# Patient Record
Sex: Female | Born: 1965 | Race: White | Hispanic: No | Marital: Married | Smoking: Former smoker
Health system: Southern US, Community
[De-identification: ages and names within clinical notes are randomized; demographics above are authoritative.]

## PROBLEM LIST (undated history)

## (undated) DIAGNOSIS — I639 Cerebral infarction, unspecified: Secondary | ICD-10-CM

## (undated) DIAGNOSIS — E785 Hyperlipidemia, unspecified: Secondary | ICD-10-CM

## (undated) DIAGNOSIS — T7840XA Allergy, unspecified, initial encounter: Secondary | ICD-10-CM

## (undated) HISTORY — DX: Cerebral infarction, unspecified: I63.9

## (undated) HISTORY — DX: Hyperlipidemia, unspecified: E78.5

## (undated) HISTORY — PX: TUBAL LIGATION: SHX77

## (undated) HISTORY — DX: Allergy, unspecified, initial encounter: T78.40XA

---

## 2007-02-11 ENCOUNTER — Encounter: Payer: Self-pay | Admitting: Maternal & Fetal Medicine

## 2007-04-25 ENCOUNTER — Encounter: Payer: Self-pay | Admitting: Obstetrics and Gynecology

## 2007-05-30 ENCOUNTER — Encounter: Payer: Self-pay | Admitting: Obstetrics and Gynecology

## 2007-07-11 ENCOUNTER — Encounter: Payer: Self-pay | Admitting: Maternal & Fetal Medicine

## 2007-08-15 ENCOUNTER — Ambulatory Visit: Payer: Self-pay | Admitting: Obstetrics and Gynecology

## 2007-08-16 ENCOUNTER — Inpatient Hospital Stay: Payer: Self-pay | Admitting: Obstetrics and Gynecology

## 2010-02-03 ENCOUNTER — Ambulatory Visit: Payer: Self-pay | Admitting: Obstetrics and Gynecology

## 2019-11-19 ENCOUNTER — Inpatient Hospital Stay
Admission: EM | Admit: 2019-11-19 | Discharge: 2019-11-21 | DRG: 066 | Disposition: A | Discharge status: Home / Self Care | Payer: BC Managed Care – PPO | Attending: Hospitalist | Admitting: Hospitalist

## 2019-11-19 ENCOUNTER — Other Ambulatory Visit: Payer: Self-pay

## 2019-11-19 DIAGNOSIS — Z880 Allergy status to penicillin: Secondary | ICD-10-CM

## 2019-11-19 DIAGNOSIS — I639 Cerebral infarction, unspecified: Principal | ICD-10-CM | POA: Diagnosis present

## 2019-11-19 DIAGNOSIS — E785 Hyperlipidemia, unspecified: Secondary | ICD-10-CM | POA: Diagnosis present

## 2019-11-19 DIAGNOSIS — Z823 Family history of stroke: Secondary | ICD-10-CM

## 2019-11-19 DIAGNOSIS — G43909 Migraine, unspecified, not intractable, without status migrainosus: Secondary | ICD-10-CM | POA: Diagnosis present

## 2019-11-19 DIAGNOSIS — Z20822 Contact with and (suspected) exposure to covid-19: Secondary | ICD-10-CM | POA: Diagnosis present

## 2019-11-19 DIAGNOSIS — I6522 Occlusion and stenosis of left carotid artery: Secondary | ICD-10-CM | POA: Diagnosis present

## 2019-11-19 DIAGNOSIS — R112 Nausea with vomiting, unspecified: Secondary | ICD-10-CM

## 2019-11-19 DIAGNOSIS — H538 Other visual disturbances: Secondary | ICD-10-CM | POA: Diagnosis present

## 2019-11-19 DIAGNOSIS — Z807 Family history of other malignant neoplasms of lymphoid, hematopoietic and related tissues: Secondary | ICD-10-CM

## 2019-11-19 DIAGNOSIS — Z825 Family history of asthma and other chronic lower respiratory diseases: Secondary | ICD-10-CM

## 2019-11-19 DIAGNOSIS — R200 Anesthesia of skin: Secondary | ICD-10-CM

## 2019-11-19 DIAGNOSIS — R519 Headache, unspecified: Secondary | ICD-10-CM

## 2019-11-19 DIAGNOSIS — R29700 NIHSS score 0: Secondary | ICD-10-CM | POA: Diagnosis present

## 2019-11-19 LAB — COMPREHENSIVE METABOLIC PANEL
ALT: 39 U/L (ref 0–44)
AST: 33 U/L (ref 15–41)
Albumin: 4.6 g/dL (ref 3.5–5.0)
Alkaline Phosphatase: 98 U/L (ref 38–126)
Anion gap: 12 (ref 5–15)
BUN: 16 mg/dL (ref 6–20)
CO2: 21 mmol/L — ABNORMAL LOW (ref 22–32)
Calcium: 9.4 mg/dL (ref 8.9–10.3)
Chloride: 105 mmol/L (ref 98–111)
Creatinine, Ser: 0.68 mg/dL (ref 0.44–1.00)
GFR calc Af Amer: >60 mL/min (ref >60)
GFR calc non Af Amer: >60 mL/min (ref >60)
Glucose, Bld: 133 mg/dL — ABNORMAL HIGH (ref 70–99)
Potassium: 3.7 mmol/L (ref 3.5–5.1)
Sodium: 138 mmol/L (ref 135–145)
Total Bilirubin: 0.9 mg/dL (ref 0.3–1.2)
Total Protein: 8.5 g/dL — ABNORMAL HIGH (ref 6.5–8.1)

## 2019-11-19 LAB — CBC
HCT: 45.3 % (ref 36.0–46.0)
Hemoglobin: 15.7 g/dL — ABNORMAL HIGH (ref 12.0–15.0)
MCH: 29.3 pg (ref 26.0–34.0)
MCHC: 34.7 g/dL (ref 30.0–36.0)
MCV: 84.5 fL (ref 80.0–100.0)
Platelets: 397 10*3/uL (ref 150–400)
RBC: 5.36 MIL/uL — ABNORMAL HIGH (ref 3.87–5.11)
RDW: 12.7 % (ref 11.5–15.5)
WBC: 13.8 10*3/uL — ABNORMAL HIGH (ref 4.0–10.5)
nRBC: 0 % (ref 0.0–0.2)

## 2019-11-19 LAB — LIPASE, BLOOD: Lipase: 28 U/L (ref 11–51)

## 2019-11-19 MED ORDER — ONDANSETRON HCL 4 MG/2ML IJ SOLN
INTRAMUSCULAR | Status: AC
  Administered 2019-11-19: 4 mg via INTRAVENOUS
  Filled 2019-11-19: qty 2

## 2019-11-19 MED ORDER — DROPERIDOL 2.5 MG/ML IJ SOLN
2.5000 mg | Freq: Once | INTRAMUSCULAR | Status: AC
  Administered 2019-11-19: 2.5 mg via INTRAVENOUS
  Filled 2019-11-19: qty 2

## 2019-11-19 MED ORDER — ONDANSETRON HCL 4 MG/2ML IJ SOLN
4.0000 mg | Freq: Once | INTRAMUSCULAR | Status: AC
  Administered 2019-11-19: 4 mg via INTRAVENOUS
  Filled 2019-11-19: qty 2

## 2019-11-19 MED ORDER — SODIUM CHLORIDE 0.9% FLUSH: 3.0000 mL | Freq: Once | INTRAVENOUS | Status: DC

## 2019-11-19 MED ORDER — ONDANSETRON HCL 4 MG/2ML IJ SOLN: 4.0000 mg | Freq: Once | INTRAMUSCULAR | Status: AC | PRN

## 2019-11-19 MED ORDER — SODIUM CHLORIDE 0.9 % IV BOLUS
1000.0000 mL | Freq: Once | INTRAVENOUS | Status: AC
  Administered 2019-11-19: 1000 mL via INTRAVENOUS

## 2019-11-19 NOTE — ED Triage Notes (Signed)
Pt to ED via EMS with new onset of vomiting and migraine this evening. Pt has hx of headaches and migraines. PT not answering many of RNs questions and reporting right arm numbness. No color or temperature change. Radial pulse intact. No weakness noted and no other neurologic deficits noted at this time. Pt is diaphoretic and dry heaving in triage.

## 2019-11-19 NOTE — ED Notes (Signed)
Pt uprite on stretcher in exam room with no distress noted; reports generalized HA today accomp by N/V; st hx of same; SO at bedside

## 2019-11-19 NOTE — ED Provider Notes (Signed)
[facility] Emergency Department Provider Note   ____________________________________________   First MD Initiated Contact with Patient 11/19/19 2320     (approximate)  I have reviewed the triage vital signs and the nursing notes.   HISTORY  Chief Complaint Emesis    HPI Samantha Powers is a 54 y.o. female brought to the ED from home via EMS with a chief complaint of migraine headache and vomiting.  Patient reports a history of migraine headaches for over 25 years, although she was never formally diagnosed and does not take any preventative medications nor sees a neurologist.  Awoke this morning with frontal headache and vomiting which is typical with her migraine headaches.  Had some right arm numbness which is new from her prior episodes.  Denies extremity weakness, slurred speech, falling or confusion.  Denies fever, cough, chest pain, shortness of breath, abdominal pain, dizziness.  Denies striking head or LOC.  Denies anticoagulant use.     Past medical history Migraine  Patient Active Problem List   Diagnosis Date Noted  . Acute CVA (cerebrovascular accident) ([place]) 11/20/2019  . Acute intractable headache 11/20/2019     Prior to Admission medications   Medication Sig Start Date End Date Taking? Authorizing Provider  ibuprofen (ADVIL) 200 MG tablet Take 200 mg by mouth every 6 (six) hours as needed for headache.   Yes [provider]    Allergies Penicillins  Family history None for migraines  Social History Social History   Tobacco Use  . Smoking status: Not on file  Substance Use Topics  . Alcohol use: Not on file  . Drug use: Not on file  Non-smoker  Review of Systems  Constitutional: No fever/chills Eyes: No visual changes. ENT: No sore throat. Cardiovascular: Denies chest pain. Respiratory: Denies shortness of breath. Gastrointestinal: No abdominal pain.  No nausea, no vomiting.  No diarrhea.  No  constipation. Genitourinary: Negative for dysuria. Musculoskeletal: Negative for back pain. Skin: Negative for rash. Neurological: Positive for headache. Negative for focal weakness or numbness.   ____________________________________________   PHYSICAL EXAM:  VITAL SIGNS: ED Triage Vitals  Enc Vitals Group     BP 11/19/19 2303 (!) 153/92     Pulse Rate 11/19/19 2303 99     Resp 11/19/19 2303 16     Temp 11/19/19 2303 98.4 F (36.9 C)     Temp Source 11/19/19 2303 Oral     SpO2 11/19/19 2303 98 %     Weight 11/19/19 2304 150 lb (68 kg)     Height 11/19/19 2304 5\' 4"  (1.626 m)     Head Circumference --      Peak Flow --      Pain Score 11/19/19 2304 10     Pain Loc --      Pain Edu? --      Excl. in [place]? --     Constitutional: Alert and oriented. Well appearing and in mild acute distress. Eyes: Conjunctivae are normal. PERRL. EOMI. Head: Atraumatic.  Frontal sinuses mildly tender to palpation. Nose: No congestion/rhinnorhea. Mouth/Throat: Mucous membranes are mildly dry. Neck: No stridor.  Supple neck without meningismus.  No carotid bruits. Cardiovascular: Normal rate, regular rhythm. Grossly normal heart sounds.  Good peripheral circulation. Respiratory: Normal respiratory effort.  No retractions. Lungs CTAB. Gastrointestinal: Actively vomiting. Soft and nontender. No distention. No abdominal bruits. No CVA tenderness. Musculoskeletal: No lower extremity tenderness nor edema.  No joint effusions. Neurologic: Alert and oriented x3.  CN II-XII  grossly intact.  Normal speech and language. No gross focal neurologic deficits are appreciated. MAEx4.  Skin:  Skin is warm, dry and intact. No rash noted.  No petechiae. Psychiatric: Mood and affect are normal. Speech and behavior are normal.  ____________________________________________   LABS (all labs ordered are listed, but only abnormal results are displayed)  Labs Reviewed  COMPREHENSIVE METABOLIC PANEL - Abnormal;  Notable for the following components:      Result Value   CO2 21 (*)    Glucose, Bld 133 (*)    Total Protein 8.5 (*)    All other components within normal limits  CBC - Abnormal; Notable for the following components:   WBC 13.8 (*)    RBC 5.36 (*)    Hemoglobin 15.7 (*)    All other components within normal limits  LIPID PANEL - Abnormal; Notable for the following components:   Cholesterol 261 (*)    Triglycerides 240 (*)    VLDL 48 (*)    LDL Cholesterol 167 (*)    All other components within normal limits  SARS CORONAVIRUS 2 (TAT 6-24 HRS)  LIPASE, BLOOD  CREATININE, SERUM  URINALYSIS, COMPLETE (UACMP) WITH MICROSCOPIC  HIV ANTIBODY (ROUTINE TESTING W REFLEX)  HEMOGLOBIN A1C  POC URINE PREG, ED   ____________________________________________  EKG  ED ECG REPORT I, Thurl Boen J, the attending physician, personally viewed and interpreted this ECG.   Date: 11/19/2019  EKG Time: 2344  Rate: 88  Rhythm: normal EKG, normal sinus rhythm  Axis: Normal  Intervals:none  ST&T Change: Nonspecific  ____________________________________________  RADIOLOGY  ED MD interpretation: Multiple small acute infarcts within the left hemisphere  Official radiology report(s): MR BRAIN WO CONTRAST  Result Date: 11/20/2019 CLINICAL DATA:  Headache with vomiting. Right arm numbness. EXAM: MRI HEAD WITHOUT CONTRAST TECHNIQUE: Multiplanar, multiecho pulse sequences of the brain and surrounding structures were obtained without intravenous contrast. COMPARISON:  None. FINDINGS: Brain: Multiple small foci of abnormal diffusion restriction within the left hemisphere, including the left occipital lobe, left parietal periatrial white matter and at the watershed zone between the left anterior and middle cerebral arteries. No diffusion abnormality in the right hemisphere or cerebellum. Multifocal white matter hyperintensity, most commonly due to chronic ischemic microangiopathy. Normal volume of CSF  spaces. No chronic microhemorrhage. Normal midline structures. Vascular: Normal flow voids. Skull and upper cervical spine: Normal marrow signal. Sinuses/Orbits: Negative. Other: None. IMPRESSION: 1. 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 2. No hemorrhage or mass effect. Electronically Signed   By: Deatra Robinson M.D.   On: 11/20/2019 02:19    ____________________________________________   PROCEDURES  Procedure(s) performed (including Critical Care):  Procedures  Interval: Baseline Time: 7:27 AM Person Administering Scale: Dreshon Proffit J  Administer stroke scale items in the order listed. Record performance in each category after each subscale exam. Do not go back and change scores. Follow directions provided for each exam technique. Scores should reflect what the patient does, not what the clinician thinks the patient can do. The clinician should record answers while administering the exam and work quickly. Except where indicated, the patient should not be coached (i.e., repeated requests to patient to make a special effort).   1a  Level of consciousness: 0=alert; keenly responsive  1b. LOC questions:  0=Performs both tasks correctly  1c. LOC commands: 0=Performs both tasks correctly  2.  Best Gaze:  0=normal  3.  Visual: 0=No visual loss  4. Facial Palsy: 0=Normal symmetric movement  5a.  Motor left arm: 0=No drift, limb holds 90 (or 45) degrees for full 10 seconds  5b.  Motor right arm: 0=No drift, limb holds 90 (or 45) degrees for full 10 seconds  6a. motor left leg: 0=No drift, limb holds 90 (or 45) degrees for full 10 seconds  6b  Motor right leg:  0=No drift, limb holds 90 (or 45) degrees for full 10 seconds  7. Limb Ataxia: 0=Absent  8.  Sensory: 0=Normal; no sensory loss  9. Best Language:  0=No aphasia, normal  10. Dysarthria:  0=Normal  11. Extinction and Inattention: 0=No abnormality  12. Distal motor function: 0=Normal   Total:   0    ____________________________________________   INITIAL IMPRESSION / ASSESSMENT AND PLAN / ED COURSE  As part of my medical decision making, I reviewed the following data within the electronic MEDICAL RECORD NUMBER History obtained from family, Nursing notes reviewed and incorporated, Labs reviewed, EKG interpreted, Old chart reviewed, Radiograph reviewed and Notes from prior ED visits     Samantha Powers was evaluated in Emergency Department on 11/20/2019 for the symptoms described in the history of present illness. She was evaluated in the context of the global COVID-19 pandemic, which necessitated consideration that the patient might be at risk for infection with the SARS-CoV-2 virus that causes COVID-19. Institutional protocols and algorithms that pertain to the evaluation of patients at risk for COVID-19 are in a state of rapid change based on information released by regulatory bodies including the CDC and federal and state organizations. These policies and algorithms were followed during the patient's care in the ED.    54 year old female who presents with migraine headache and vomiting. Differential diagnosis includes, but is not limited to, intracranial hemorrhage, meningitis/encephalitis, previous head trauma, cavernous venous thrombosis, tension headache, temporal arteritis, migraine or migraine equivalent, idiopathic intracranial hypertension, and non-specific headache.  Will initiate IV fluid resuscitation, IV headache cocktail.  MRI brain for complaints of RUE numbness.   Clinical Course as of Nov 19 725  Thu Nov 20, 2019  0131 Headache improved.  No vomiting.  Going to MRI.   [JS]  0300 Updated patient and spouse on MRI results.  Will administer aspirin and discuss with hospitalist services for admission.   [JS]    Clinical Course User Index [JS] Irean Hong, MD      ____________________________________________   FINAL CLINICAL IMPRESSION(S) / ED DIAGNOSES  Final diagnoses:  Acute nonintractable headache, unspecified headache type  Non-intractable vomiting with nausea, unspecified vomiting type  Numbness  Cerebrovascular accident (CVA), unspecified mechanism [facility])     ED Discharge Orders    None       Note:  This document was prepared using Dragon voice recognition software and may include unintentional dictation errors.   Irean Hong, MD 11/20/19 [phone]

## 2019-11-19 NOTE — ED Notes (Signed)
1st RN note: pt arrives from home via [facility] with c/o emesis and headache x1 day. No reported visual changes. Pt took OTC Advil without relief. EMS reports CBG 127; BP 182/84.

## 2019-11-20 ENCOUNTER — Emergency Department: Payer: BC Managed Care – PPO

## 2019-11-20 ENCOUNTER — Observation Stay
Admit: 2019-11-20 | Discharge: 2019-11-20 | Disposition: A | Discharge status: Home / Self Care | Payer: BC Managed Care – PPO | Attending: Internal Medicine | Admitting: Internal Medicine

## 2019-11-20 ENCOUNTER — Encounter: Payer: Self-pay | Admitting: Hospitalist

## 2019-11-20 ENCOUNTER — Inpatient Hospital Stay: Payer: BC Managed Care – PPO

## 2019-11-20 DIAGNOSIS — Z823 Family history of stroke: Secondary | ICD-10-CM | POA: Diagnosis not present

## 2019-11-20 DIAGNOSIS — R29700 NIHSS score 0: Secondary | ICD-10-CM | POA: Diagnosis present

## 2019-11-20 DIAGNOSIS — R112 Nausea with vomiting, unspecified: Secondary | ICD-10-CM | POA: Diagnosis present

## 2019-11-20 DIAGNOSIS — E785 Hyperlipidemia, unspecified: Secondary | ICD-10-CM | POA: Diagnosis present

## 2019-11-20 DIAGNOSIS — I639 Cerebral infarction, unspecified: Principal | ICD-10-CM

## 2019-11-20 DIAGNOSIS — Z807 Family history of other malignant neoplasms of lymphoid, hematopoietic and related tissues: Secondary | ICD-10-CM | POA: Diagnosis not present

## 2019-11-20 DIAGNOSIS — R519 Headache, unspecified: Secondary | ICD-10-CM | POA: Diagnosis not present

## 2019-11-20 DIAGNOSIS — G43909 Migraine, unspecified, not intractable, without status migrainosus: Secondary | ICD-10-CM | POA: Diagnosis present

## 2019-11-20 DIAGNOSIS — Z880 Allergy status to penicillin: Secondary | ICD-10-CM | POA: Diagnosis not present

## 2019-11-20 DIAGNOSIS — Z20822 Contact with and (suspected) exposure to covid-19: Secondary | ICD-10-CM | POA: Diagnosis present

## 2019-11-20 DIAGNOSIS — Z825 Family history of asthma and other chronic lower respiratory diseases: Secondary | ICD-10-CM | POA: Diagnosis not present

## 2019-11-20 DIAGNOSIS — H538 Other visual disturbances: Secondary | ICD-10-CM | POA: Diagnosis present

## 2019-11-20 DIAGNOSIS — I6522 Occlusion and stenosis of left carotid artery: Secondary | ICD-10-CM | POA: Diagnosis present

## 2019-11-20 LAB — URINALYSIS, COMPLETE (UACMP) WITH MICROSCOPIC
Bacteria, UA: NONE SEEN
Bilirubin Urine: NEGATIVE
Glucose, UA: NEGATIVE mg/dL
Hgb urine dipstick: NEGATIVE
Ketones, ur: 20 mg/dL — AB
Nitrite: NEGATIVE
Protein, ur: NEGATIVE mg/dL
Specific Gravity, Urine: 1.015 (ref 1.005–1.030)
pH: 6 (ref 5.0–8.0)

## 2019-11-20 LAB — HEMOGLOBIN A1C
Hgb A1c MFr Bld: 5.7 % — ABNORMAL HIGH (ref 4.8–5.6)
Mean Plasma Glucose: 116.89 mg/dL

## 2019-11-20 LAB — LIPID PANEL
Cholesterol: 261 mg/dL — ABNORMAL HIGH (ref 0–200)
HDL: 46 mg/dL
LDL Cholesterol: 167 mg/dL — ABNORMAL HIGH (ref 0–99)
Total CHOL/HDL Ratio: 5.7 ratio
Triglycerides: 240 mg/dL — ABNORMAL HIGH
VLDL: 48 mg/dL — ABNORMAL HIGH (ref 0–40)

## 2019-11-20 LAB — CREATININE, SERUM
Creatinine, Ser: 0.64 mg/dL (ref 0.44–1.00)
GFR calc Af Amer: >60 mL/min (ref >60)
GFR calc non Af Amer: >60 mL/min (ref >60)

## 2019-11-20 LAB — ANTITHROMBIN III: AntiThromb III Func: 120 % (ref 75–120)

## 2019-11-20 LAB — ECHOCARDIOGRAM COMPLETE
Height: 64 in
Weight: 2400 oz

## 2019-11-20 LAB — PROTIME-INR
INR: 1.1 (ref 0.8–1.2)
Prothrombin Time: 13.8 s (ref 11.4–15.2)

## 2019-11-20 LAB — POCT PREGNANCY, URINE: Preg Test, Ur: NEGATIVE

## 2019-11-20 LAB — SARS CORONAVIRUS 2 (TAT 6-24 HRS): SARS Coronavirus 2: NEGATIVE

## 2019-11-20 LAB — HIV ANTIBODY (ROUTINE TESTING W REFLEX): HIV Screen 4th Generation wRfx: NONREACTIVE

## 2019-11-20 MED ORDER — CLOPIDOGREL BISULFATE 75 MG PO TABS
75.0000 mg | ORAL_TABLET | Freq: Every day | ORAL | Status: DC
  Administered 2019-11-21: 75 mg via ORAL
  Filled 2019-11-20: qty 1

## 2019-11-20 MED ORDER — ACETAMINOPHEN 325 MG PO TABS
650.0000 mg | ORAL_TABLET | ORAL | Status: DC | PRN
  Administered 2019-11-20 – 2019-11-21 (×4): 650 mg via ORAL
  Filled 2019-11-20 (×4): qty 2

## 2019-11-20 MED ORDER — STROKE: EARLY STAGES OF RECOVERY BOOK: Freq: Once | Status: DC

## 2019-11-20 MED ORDER — ATORVASTATIN CALCIUM 20 MG PO TABS: 20.0000 mg | ORAL_TABLET | Freq: Every day | ORAL | Status: DC

## 2019-11-20 MED ORDER — SENNOSIDES-DOCUSATE SODIUM 8.6-50 MG PO TABS: 1.0000 | ORAL_TABLET | Freq: Every evening | ORAL | Status: DC | PRN

## 2019-11-20 MED ORDER — ACETAMINOPHEN 160 MG/5ML PO SOLN
650.0000 mg | ORAL | Status: DC | PRN
  Filled 2019-11-20: qty 20.3

## 2019-11-20 MED ORDER — ASPIRIN EC 325 MG PO TBEC
325.0000 mg | DELAYED_RELEASE_TABLET | Freq: Every day | ORAL | Status: DC
  Administered 2019-11-20: 325 mg via ORAL
  Filled 2019-11-20: qty 1

## 2019-11-20 MED ORDER — ASPIRIN EC 81 MG PO TBEC
81.0000 mg | DELAYED_RELEASE_TABLET | Freq: Every day | ORAL | Status: DC
  Administered 2019-11-21: 09:00:00 81 mg via ORAL
  Filled 2019-11-20: qty 1

## 2019-11-20 MED ORDER — ENOXAPARIN SODIUM 40 MG/0.4ML ~~LOC~~ SOLN
40.0000 mg | SUBCUTANEOUS | Status: DC
  Administered 2019-11-20 – 2019-11-21 (×2): 40 mg via SUBCUTANEOUS
  Filled 2019-11-20 (×3): qty 0.4

## 2019-11-20 MED ORDER — ATORVASTATIN CALCIUM 20 MG PO TABS: 40.0000 mg | ORAL_TABLET | Freq: Every day | ORAL | Status: DC

## 2019-11-20 MED ORDER — DROPERIDOL 2.5 MG/ML IJ SOLN
2.5000 mg | Freq: Once | INTRAMUSCULAR | Status: AC
  Administered 2019-11-20: 2.5 mg via INTRAVENOUS
  Filled 2019-11-20: qty 2

## 2019-11-20 MED ORDER — ACETAMINOPHEN 650 MG RE SUPP: 650.0000 mg | RECTAL | Status: DC | PRN

## 2019-11-20 MED ORDER — IOHEXOL 350 MG/ML SOLN
75.0000 mL | Freq: Once | INTRAVENOUS | Status: AC | PRN
  Administered 2019-11-20: 75 mL via INTRAVENOUS
  Filled 2019-11-20: qty 75

## 2019-11-20 NOTE — Consult Note (Signed)
Requesting Physician: Fran Lowes    Chief Complaint: Headache, nausea, vomiting  I have been asked by Dr. Fran Lowes to see this patient in consultation for stroke.  HPI: Samantha Powers is an 54 y.o. female with no significant PMH who presented on 3/17 with headache.  Patient has a history of migraines for which she usually takes Advil and spends most of the day in bed.  Awakened yesterday with severe headache.  Also had nausea and vomiting for the majority of the day.  Her husband reports that her words were not coming out correctly.  He finally called EMS.  On arrival the patient reports that her hands were drawing bilaterally but she never experienced any extremity weakness.  Now feels back to baseline for the most part.  Initial NIHSS of 0.  Date last known well: 11/19/2019 Time last known well: Time: 00:00 tPA Given: No: Resolution of symptoms  Past medical history: Migraine   Family history: Maternal GF with stroke.  Father deceased at 77 with lymphoma.  Mother alive with COPD.   Social History:  No history of tobacco abuse  Allergies:  Allergies  Allergen Reactions  . Penicillins     Medications: None  ROS: History obtained from the patient  General ROS: negative for - chills, fatigue, fever, night sweats, weight gain or weight loss Psychological ROS: negative for - behavioral disorder, hallucinations, memory difficulties, mood swings or suicidal ideation Ophthalmic ROS: negative for - blurry vision, double vision, eye pain or loss of vision ENT ROS: negative for - epistaxis, nasal discharge, oral lesions, sore throat, tinnitus or vertigo Allergy and Immunology ROS: negative for - hives or itchy/watery eyes Hematological and Lymphatic ROS: negative for - bleeding problems, bruising or swollen lymph nodes Endocrine ROS: negative for - galactorrhea, hair pattern changes, polydipsia/polyuria or temperature intolerance Respiratory ROS: negative for - cough, hemoptysis, shortness of breath  or wheezing Cardiovascular ROS: negative for - chest pain, dyspnea on exertion, edema or irregular heartbeat Gastrointestinal ROS: as noted in HPI Genito-Urinary ROS: negative for - dysuria, hematuria, incontinence or urinary frequency/urgency Musculoskeletal ROS: negative for - joint swelling or muscular weakness Neurological ROS: as noted in HPI Dermatological ROS: negative for rash and skin lesion changes  Physical Examination: Blood pressure (!) 168/99, pulse (!) 111, temperature 98.4 F (36.9 C), temperature source Oral, resp. rate 18, height 5\' 4"  (1.626 m), weight 68 kg, SpO2 96 %.  HEENT-  Normocephalic, no lesions, without obvious abnormality.  Normal external eye and conjunctiva.  Normal TM's bilaterally.  Normal auditory canals and external ears. Normal external nose, mucus membranes and septum.  Normal pharynx. Cardiovascular- S1, S2 normal, pulses palpable throughout   Lungs- chest clear, no wheezing, rales, normal symmetric air entry Abdomen- soft, non-tender; bowel sounds normal; no masses,  no organomegaly Extremities- no edema Lymph-no adenopathy palpable Musculoskeletal-no joint tenderness, deformity or swelling Skin-warm and dry, no hyperpigmentation, vitiligo, or suspicious lesions  Neurological Examination   Mental Status: Alert, oriented, thought content appropriate.  Speech fluent without evidence of aphasia.  Able to follow 3 step commands without difficulty. Cranial Nerves: II: Discs flat bilaterally; Visual fields grossly normal, pupils equal, round, reactive to light and accommodation III,IV, VI: ptosis not present, extra-ocular motions intact bilaterally V,VII: smile symmetric, facial light touch sensation normal bilaterally VIII: hearing normal bilaterally IX,X: gag reflex present XI: bilateral shoulder shrug XII: midline tongue extension Motor: Right : Upper extremity   5/5    Left:     Upper extremity  5/5  Lower extremity   5/5     Lower extremity    5/5 Tone and bulk:normal tone throughout; no atrophy noted Sensory: Pinprick and light touch intact throughout, bilaterally Deep Tendon Reflexes: Symmetric throughout Plantars: Right: downgoing   Left: downgoing Cerebellar: Normal finger-to-nose and normal heel-to-shin testing bilaterally Gait: not tested due to safety concerns    Laboratory Studies:  Basic Metabolic Panel: Recent Labs  Lab 11/19/19 2254 11/20/19 0337  NA 138  --   K 3.7  --   CL 105  --   CO2 21*  --   GLUCOSE 133*  --   BUN 16  --   CREATININE 0.68 0.64  CALCIUM 9.4  --     Liver Function Tests: Recent Labs  Lab 11/19/19 2254  AST 33  ALT 39  ALKPHOS 98  BILITOT 0.9  PROT 8.5*  ALBUMIN 4.6   Recent Labs  Lab 11/19/19 2254  LIPASE 28   No results for input(s): AMMONIA in the last 168 hours.  CBC: Recent Labs  Lab 11/19/19 2254  WBC 13.8*  HGB 15.7*  HCT 45.3  MCV 84.5  PLT 397    Cardiac Enzymes: No results for input(s): CKTOTAL, CKMB, CKMBINDEX, TROPONINI in the last 168 hours.  BNP: Invalid input(s): POCBNP  CBG: No results for input(s): GLUCAP in the last 168 hours.  Microbiology: No results found for this or any previous visit.  Coagulation Studies: No results for input(s): LABPROT, INR in the last 72 hours.  Urinalysis:  Recent Labs  Lab 11/20/19 0337  COLORURINE YELLOW*  LABSPEC 1.015  PHURINE 6.0  GLUCOSEU NEGATIVE  HGBUR NEGATIVE  BILIRUBINUR NEGATIVE  KETONESUR 20*  PROTEINUR NEGATIVE  NITRITE NEGATIVE  LEUKOCYTESUR TRACE*    Lipid Panel:    Component Value Date/Time   CHOL 261 (H) 11/19/2019 2254   TRIG 240 (H) 11/19/2019 2254   HDL 46 11/19/2019 2254   CHOLHDL 5.7 11/19/2019 2254   VLDL 48 (H) 11/19/2019 2254   LDLCALC 167 (H) 11/19/2019 2254    HgbA1C: No results found for: HGBA1C  Urine Drug Screen:  No results found for: LABOPIA, COCAINSCRNUR, LABBENZ, AMPHETMU, THCU, LABBARB  Alcohol Level: No results for input(s): ETH in the last 168  hours.  Other results: EKG: sinus rhythm at 88 bpm.  Imaging: MR BRAIN WO CONTRAST  Result Date: 11/20/2019 CLINICAL DATA:  Headache with vomiting. Right arm numbness. EXAM: MRI HEAD WITHOUT CONTRAST TECHNIQUE: Multiplanar, multiecho pulse sequences of the brain and surrounding structures were obtained without intravenous contrast. COMPARISON:  None. FINDINGS: Brain: Multiple small foci of abnormal diffusion restriction within the left hemisphere, including the left occipital lobe, left parietal periatrial white matter and at the watershed zone between the left anterior and middle cerebral arteries. No diffusion abnormality in the right hemisphere or cerebellum. Multifocal white matter hyperintensity, most commonly due to chronic ischemic microangiopathy. Normal volume of CSF spaces. No chronic microhemorrhage. Normal midline structures. Vascular: Normal flow voids. Skull and upper cervical spine: Normal marrow signal. Sinuses/Orbits: Negative. Other: None. IMPRESSION: 1. 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 2. No hemorrhage or mass effect. Electronically Signed   By: Ulyses Jarred M.D.   On: 11/20/2019 02:19    Assessment: 54 y.o. female with no known PMH presenting with headache, nausea, vomiting and difficulty with speech.  Patient now at baseline.  MRI  of the brain personally reviewed and shows multiple small infarcts in the left hemisphere.  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  Patient on no antiplatelet therapy prior to admission.  Has not seen a physician in quite some time.  Further work up for stroke etiology recommended.    Stroke Risk Factors - hyperlipidemia  Plan: 1. HgbA1c, fasting lipid panel, hypercoag panel 2. CTA of the head and neck 3. PT consult, OT consult, Speech consult 4. Echocardiogram with bubble study 5. Prophylactic  therapy-Dual antiplatelet therapy with ASA 81mg  and Plavix 75mg  for three weeks with change to ASA 81mg  alone as monotherapy after that time. 6. NPO until RN stroke swallow screen 7. Telemetry monitoring 8. Frequent neuro checks  , MD Neurology [phone] 11/20/2019, 10:39 AM

## 2019-11-20 NOTE — ED Notes (Signed)
Pt to MRI via stretcher accomp by MRI tech 

## 2019-11-20 NOTE — ED Notes (Signed)
Dr. Duncan at bedside 

## 2019-11-20 NOTE — H&P (Signed)
History and Physical    Samantha Powers:[id number] DOB: 1966/01/29 DOA: 11/19/2019  PCP: Patient, No Pcp Per   Patient coming from: home I have personally briefly reviewed patient's old medical records in [facility]  Chief Complaint: headache and vomiting, right upper extremity numbness  HPI: Samantha Powers is a 54 y.o. female with medical history significant for longstanding history of migraines, self diagnosed with no prior neurology evaluation who presents to the emergency room with a 1 day history of headache and vomiting, typical for her migraines at this time associated with numbness in bilateral hands, since resolved.  She denied fever or chills, visual disturbance or weakness in the face or extremities.  She stated the headache was a right frontal headache typical for her migraines.  The vomiting was nonbloody nonbilious and she had 3 episodes which is not unusual for her migraines  ED Course: On arrival to the emergency room vitals were within normal limits except for slightly elevated blood pressure of 153/92.  Blood work was unremarkable except for slightly elevated white cell count of 13,800.  Lipase was 28.  MRI revealed acute infarcts within the left hemisphere.  Patient received droperidol and ondansetron in the ER with relief of a headache.  Hospitalist consulted for admission Review of Systems: As per HPI otherwise 10 point review of systems negative.    No past medical history on file.  History of migraines   has no history on file for tobacco, alcohol, and drug.  Allergies  Allergen Reactions  . Penicillins     No family history on file.   Prior to Admission medications   Not on File    Physical Exam: Vitals:   11/19/19 2303 11/19/19 2304 11/19/19 2322  BP: (!) 153/92  (!) 174/95  Pulse: 99  89  Resp: 16  18  Temp: 98.4 F (36.9 C)    TempSrc: Oral    SpO2: 98%  99%  Weight:  68 kg   Height:  5\' 4"  (1.626 m)      Vitals:   11/19/19 2303  11/19/19 2304 11/19/19 2322  BP: (!) 153/92  (!) 174/95  Pulse: 99  89  Resp: 16  18  Temp: 98.4 F (36.9 C)    TempSrc: Oral    SpO2: 98%  99%  Weight:  68 kg   Height:  5\' 4"  (1.626 m)     Constitutional: Alert and awake, oriented x3, not in any acute distress. Eyes: PERLA, EOMI, irises appear normal, anicteric sclera,  ENMT: external ears and nose appear normal, normal hearing             Lips appears normal, oropharynx mucosa, tongue, posterior pharynx appear normal  Neck: neck appears normal, no masses, normal ROM, no thyromegaly, no JVD  CVS: S1-S2 clear, no murmur rubs or gallops,  , no carotid bruits, pedal pulses palpable, No LE edema Respiratory:  clear to auscultation bilaterally, no wheezing, rales or rhonchi. Respiratory effort normal. No accessory muscle use.  Abdomen: soft nontender, nondistended, normal bowel sounds, no hepatosplenomegaly, no hernias Musculoskeletal: : no cyanosis, clubbing , no contractures or atrophy Neuro: Cranial nerves II-XII intact, sensation, reflexes normal, strengt Psych: judgement and insight appear normal, stable mood and affect,  Skin: no rashes or lesions or ulcers, no induration or nodules   Labs on Admission: I have personally reviewed following labs and imaging studies  CBC: Recent Labs  Lab 11/19/19 2254  WBC 13.8*  HGB 15.7*  HCT  45.3  MCV 84.5  PLT 062   Basic Metabolic Panel: Recent Labs  Lab 11/19/19 2254  NA 138  K 3.7  CL 105  CO2 21*  GLUCOSE 133*  BUN 16  CREATININE 0.68  CALCIUM 9.4   GFR: Estimated Creatinine Clearance: 77.9 mL/min (by C-G formula based on SCr of 0.68 mg/dL). Liver Function Tests: Recent Labs  Lab 11/19/19 2254  AST 33  ALT 39  ALKPHOS 98  BILITOT 0.9  PROT 8.5*  ALBUMIN 4.6   Recent Labs  Lab 11/19/19 2254  LIPASE 28   No results for input(s): AMMONIA in the last 168 hours. Coagulation Profile: No results for input(s): INR, PROTIME in the last 168 hours. Cardiac  Enzymes: No results for input(s): CKTOTAL, CKMB, CKMBINDEX, TROPONINI in the last 168 hours. BNP (last 3 results) No results for input(s): PROBNP in the last 8760 hours. HbA1C: No results for input(s): HGBA1C in the last 72 hours. CBG: No results for input(s): GLUCAP in the last 168 hours. Lipid Profile: No results for input(s): CHOL, HDL, LDLCALC, TRIG, CHOLHDL, LDLDIRECT in the last 72 hours. Thyroid Function Tests: No results for input(s): TSH, T4TOTAL, FREET4, T3FREE, THYROIDAB in the last 72 hours. Anemia Panel: No results for input(s): VITAMINB12, FOLATE, FERRITIN, TIBC, IRON, RETICCTPCT in the last 72 hours. Urine analysis: No results found for: COLORURINE, APPEARANCEUR, LABSPEC, PHURINE, GLUCOSEU, HGBUR, BILIRUBINUR, KETONESUR, PROTEINUR, UROBILINOGEN, NITRITE, LEUKOCYTESUR  Radiological Exams on Admission: MR BRAIN WO CONTRAST  Result Date: 11/20/2019 CLINICAL DATA:  Headache with vomiting. Right arm numbness. EXAM: MRI HEAD WITHOUT CONTRAST TECHNIQUE: Multiplanar, multiecho pulse sequences of the brain and surrounding structures were obtained without intravenous contrast. COMPARISON:  None. FINDINGS: Brain: Multiple small foci of abnormal diffusion restriction within the left hemisphere, including the left occipital lobe, left parietal periatrial white matter and at the watershed zone between the left anterior and middle cerebral arteries. No diffusion abnormality in the right hemisphere or cerebellum. Multifocal white matter hyperintensity, most commonly due to chronic ischemic microangiopathy. Normal volume of CSF spaces. No chronic microhemorrhage. Normal midline structures. Vascular: Normal flow voids. Skull and upper cervical spine: Normal marrow signal. Sinuses/Orbits: Negative. Other: None. IMPRESSION: 1. Multiple small acute infarcts within the left hemisphere, including the left occipital lobe, left parietal lobe and at the watershed zone between the left anterior and middle  cerebral arteries. 2. No hemorrhage or mass effect. Electronically Signed   By: Ulyses Jarred M.D.   On: 11/20/2019 02:19    EKG: Independently reviewed.   Assessment/Plan    Acute CVA (cerebrovascular accident) [facility])  -Patient presented with a 1 day history of intractable headache and vomiting associated with right upper extremity numbness which resolved by arrival -MRI showing multiple small acute infarcts left hemisphere -TPA not administered as she presented outside the TPA window.  NIH stroke score was 0 her symptoms had completely resolved -Work-up to include cardiac monitoring to evaluate for arrhythmias, carotid Doppler, echocardiogram -Physical speech and occupational therapy evaluation -Aspirin and statin -Neurology consult    Acute intractable headache -Improved with droperidol and Zofran in the emergency room -Tramadol as needed once swallow study passed    DVT prophylaxis: Lovenox  Code Status: full code  Family Communication:  none  Disposition Plan: Back to previous home environment Consults called: neurology Status:obs    Athena Masse MD [facility]     11/20/2019, 3:19 AM

## 2019-11-20 NOTE — ED Notes (Signed)
Pt resting quietly in darkened exam room with eyes closed; awakens easily and st feeling much better now; denies nausea; unable to give pain score but st feeling better now

## 2019-11-20 NOTE — ED Notes (Signed)
Pt is tossing and turning in bed but denies needing blankets, pillows or medications for pain control. Husband remains at bedside and denies needing anything more than a pillow and blanket at this time. Call bell in reach and pts husband updated on treatment plan.

## 2019-11-20 NOTE — Progress Notes (Signed)
*  PRELIMINARY RESULTS* Echocardiogram 2D Echocardiogram has been performed.  Samantha Powers 11/20/2019, 10:38 AM

## 2019-11-20 NOTE — ED Notes (Signed)
Provided pt with lunch tray at this time. 

## 2019-11-20 NOTE — Progress Notes (Signed)
SLP Cancellation Note  Patient Details Name: Samantha Powers MRN: [id number] DOB: 10/12/1965   Cancelled treatment:       Reason Eval/Treat Not Completed: SLP screened, no needs identified, will sign off(chart reviewed; consulted NSG then met w/ pt in room). Pt denied any difficulty swallowing and is currently on a regular diet; tolerates swallowing pills w/ water per NSG. Pt stated she is not "very hungry right now". Pt conversed in conversation w/out deficits noted; pt and Daughtr denied any speech-language deficits. Pt endorsed a headache(baseline) and lack of sleep. Discussed reducing environmental stim to allow for rest as much as possible. NSG updated.  No further skilled ST services indicated as pt appears at her baseline. Pt agreed. NSG to reconsult if any change in status while admitted.      Orinda Kenner, MS, CCC-SLP Toshio Slusher 11/20/2019, 3:38 PM

## 2019-11-20 NOTE — Progress Notes (Signed)
PT Cancellation Note  Patient Details Name: Samantha Powers MRN: [id number] DOB: [birth year]   Cancelled Treatment:    Reason Eval/Treat Not Completed: (Consult received and chart reviewed.  Upon arrival to room, transport present for transition to inpatient room.  Will re-attempt PT eval once patient admitted and available for full evaluation.)   Tishana Clinkenbeard H. Manson Passey, PT, DPT, NCS 11/20/19, 3:35 PM [phone]

## 2019-11-20 NOTE — ED Notes (Signed)
This RN contacted floor to speak to nurse receiving pt. Secretary states nurse unable to take reports at this time. 1C Charge nurse Marcene Corning) reports she is contacting ED charge in reference to this pt room assisgnment

## 2019-11-20 NOTE — Progress Notes (Signed)
PROGRESS NOTE    Samantha Powers  BPZ:[id number] DOB: [birth year] DOA: 11/19/2019 PCP: Patient, No Pcp Per    Assessment & Plan:   Active Problems:   Acute CVA (cerebrovascular accident) (HCC)   Acute intractable headache   CVA (cerebral vascular accident) (HCC)    Samantha Powers is a 54 y.o. Caucasian female with medical history significant for longstanding history of migraines, self diagnosed with no prior neurology evaluation who presents to the emergency room with a 1 day history of headache and vomiting, typical for her migraines at this time associated with numbness in bilateral hands, since resolved.    Acute CVA (cerebrovascular accident) [facility])  -Patient presented with a 1 day history of intractable headache and vomiting associated with right upper extremity numbness which resolved by arrival -MRI showing multiple small acute infarcts left hemisphere, likely embolic etiology, per neuro. -TPA not administered as she presented outside the TPA window.  NIH stroke score was 0 her symptoms had completely resolved --LDL 167, A1c 5.7.  CTA showed "Predominantly soft plaque within the distal left CCA and proximal left ICA. Resultant 50-60% stenosis of the proximal left ICA."  No significant stenosis in the common carotids.  Echocardiogram with bubble study showed no shunt. --PT consult, OT consult, Speech consult identified no needs. PLAN: --Prophylactic therapy-Dual antiplatelet therapy with ASA 81mg  and Plavix 75mg  for three weeks with change to ASA 81mg  alone as monotherapy after that time. --Telemetry monitoring --Frequent neuro checks --Increase lipitor to 40 mg daily  Acute intractable headache -Improved with droperidol (restricted to ED use) and Zofran in the emergency room --migraine cocktail PRN   DVT prophylaxis: Lovenox SQ Code Status: Full code  Family Communication: husband updated at bedside Disposition Plan: Home likely tomorrow   Subjective and Interval  History:  Pt reported headache improved.  No neurological deficit.  No fever, dyspnea, chest pain, abdominal pain, N/V/D, dysuria.   Objective: Vitals:   11/20/19 0630 11/20/19 0808 11/20/19 1500 11/20/19 1529  BP: (!) 180/82 (!) 168/99  (!) 148/93  Pulse: (!) 124 (!) 111  92  Resp: 16 18 13 16   Temp:    98.5 F (36.9 C)  TempSrc:    Oral  SpO2: 99% 96%  98%  Weight:      Height:        Intake/Output Summary (Last 24 hours) at 11/20/2019 1912 Last data filed at 11/20/2019 0100 Gross per 24 hour  Intake 1000 ml  Output --  Net 1000 ml   Filed Weights   11/19/19 2304  Weight: 68 kg    Examination:   Constitutional: NAD, AAOx3 HEENT: conjunctivae and lids normal, EOMI CV: RRR no M,R,G. Distal pulses +2.  No cyanosis.   RESP: CTA B/L, normal respiratory effort  GI: +BS, NTND Extremities: No effusions, edema, or tenderness in BLE SKIN: warm, dry and intact Neuro: II - XII grossly intact.  Sensation intact Psych: anxious mood and affect.  Appropriate judgement and reason   Data Reviewed: I have personally reviewed following labs and imaging studies  CBC: Recent Labs  Lab 11/19/19 2254  WBC 13.8*  HGB 15.7*  HCT 45.3  MCV 84.5  PLT 397   Basic Metabolic Panel: Recent Labs  Lab 11/19/19 2254 11/20/19 0337  NA 138  --   K 3.7  --   CL 105  --   CO2 21*  --   GLUCOSE 133*  --   BUN 16  --   CREATININE 0.68 0.64  CALCIUM 9.4  --    GFR: Estimated Creatinine Clearance: 77.9 mL/min (by C-G formula based on SCr of 0.64 mg/dL). Liver Function Tests: Recent Labs  Lab 11/19/19 2254  AST 33  ALT 39  ALKPHOS 98  BILITOT 0.9  PROT 8.5*  ALBUMIN 4.6   Recent Labs  Lab 11/19/19 2254  LIPASE 28   No results for input(s): AMMONIA in the last 168 hours. Coagulation Profile: Recent Labs  Lab 11/20/19 1123  INR 1.1   Cardiac Enzymes: No results for input(s): CKTOTAL, CKMB, CKMBINDEX, TROPONINI in the last 168 hours. BNP (last 3 results) No results  for input(s): PROBNP in the last 8760 hours. HbA1C: Recent Labs    11/19/19 2254  HGBA1C 5.7*   CBG: No results for input(s): GLUCAP in the last 168 hours. Lipid Profile: Recent Labs    11/19/19 2254  CHOL 261*  HDL 46  LDLCALC 167*  TRIG 240*  CHOLHDL 5.7   Thyroid Function Tests: No results for input(s): TSH, T4TOTAL, FREET4, T3FREE, THYROIDAB in the last 72 hours. Anemia Panel: No results for input(s): VITAMINB12, FOLATE, FERRITIN, TIBC, IRON, RETICCTPCT in the last 72 hours. Sepsis Labs: No results for input(s): PROCALCITON, LATICACIDVEN in the last 168 hours.  Recent Results (from the past 240 hour(s))  SARS CORONAVIRUS 2 (TAT 6-24 HRS) Nasopharyngeal Nasopharyngeal Swab     Status: None   Collection Time: 11/20/19  3:16 AM   Specimen: Nasopharyngeal Swab  Result Value Ref Range Status   SARS Coronavirus 2 NEGATIVE NEGATIVE Final    Comment: (NOTE) SARS-CoV-2 target nucleic acids are NOT DETECTED. The SARS-CoV-2 RNA is generally detectable in upper and lower respiratory specimens during the acute phase of infection. Negative results do not preclude SARS-CoV-2 infection, do not rule out co-infections with other pathogens, and should not be used as the sole basis for treatment or other patient management decisions. Negative results must be combined with clinical observations, patient history, and epidemiological information. The expected result is Negative. Fact Sheet for Patients: HairSlick.no Fact Sheet for Healthcare Providers: quierodirigir.com This test is not yet approved or cleared by the [place] FDA and  has been authorized for detection and/or diagnosis of SARS-CoV-2 by FDA under an Emergency Use Authorization (EUA). This EUA will remain  in effect (meaning this test can be used) for the duration of the COVID-19 declaration under Section 56 4(b)(1) of the Act, 21 U.S.C. section 360bbb-3(b)(1),  unless the authorization is terminated or revoked sooner. Performed at [facility], [address], [place], [place] [address]       Radiology Studies: CT ANGIO HEAD W OR WO CONTRAST  Result Date: 11/20/2019 CLINICAL DATA:  Stroke, follow-up. EXAM: CT ANGIOGRAPHY HEAD AND NECK TECHNIQUE: Multidetector CT imaging of the head and neck was performed using the standard protocol during bolus administration of intravenous contrast. Multiplanar CT image reconstructions and MIPs were obtained to evaluate the vascular anatomy. Carotid stenosis measurements (when applicable) are obtained utilizing NASCET criteria, using the distal internal carotid diameter as the denominator. CONTRAST:  39mL OMNIPAQUE IOHEXOL 350 MG/ML SOLN COMPARISON:  Brain MRI 11/20/2018 FINDINGS: CT HEAD FINDINGS Brain: Redemonstrated small acute infarct within the left occipital lobe. Additional known acute infarcts within the left parietal lobe and within the left cerebral hemisphere at the watershed zone between the left ACA and MCA territories were better appreciated on MRI performed earlier the same day. No evidence of hemorrhagic conversion. No significant mass effect. No midline shift. No extra-axial fluid collection.  Cerebral volume is normal for age. Vascular: Reported below. Skull: Normal. Negative for fracture or focal lesion. Sinuses: Small amount of frothy secretions layering within the left maxillary sinus. No significant mastoid effusion. Orbits: No acute abnormality. Review of the MIP images confirms the above findings CTA NECK FINDINGS Aortic arch: Common origin of the innominate and left common carotid arteries. The visualized aortic arch is unremarkable. No innominate or proximal subclavian artery stenosis Right carotid system: CCA and ICA patent within the neck without stenosis. Minimal mixed plaque within the proximal ICA. Left carotid system: The CCA is patent to the bifurcation without significant stenosis (50% or  greater) prominently soft plaque within the carotid bifurcation and extending into the proximal ICA. Resultant 50-60% stenosis of the proximal ICA. Distal to this, the ICA is patent within the neck without significant stenosis. Vertebral arteries: The left vertebral artery is dominant. The vertebral arteries are patent within the neck without significant stenosis Skeleton: No acute bony abnormality or aggressive osseous lesion. Other neck: No soft tissue neck mass. Cervical chain lymph nodes are prominent in number throughout the neck. Upper chest: No consolidation within the imaged lung apices. Review of the MIP images confirms the above findings CTA HEAD FINDINGS Anterior circulation: The intracranial internal carotid arteries are patent without significant stenosis. No M2 proximal branch occlusion or high-grade proximal arterial stenosis is identified. The anterior cerebral arteries are patent without high-grade proximal stenosis. No intracranial aneurysm is identified. Posterior circulation: The intracranial vertebral arteries are patent without significant stenosis, as is the basilar artery. The bilateral posterior cerebral arteries are patent proximally without high-grade stenosis. Posterior communicating arteries are hypoplastic or absent bilaterally. Venous sinuses: Within limitations of contrast timing, no convincing thrombus. Anatomic variants: As described Review of the MIP images confirms the above findings IMPRESSION: CT head: Known small acute infarcts within the left cerebral hemisphere involving the left occipital lobe, left parietal lobe and at the watershed zone between the left ACA and MCA vascular territories, some of which were better appreciated on same-day brain MRI. No hemorrhagic conversion. No interval infarct is identified. No significant mass effect. CTA neck: 1. Predominantly soft plaque within the distal left CCA and proximal left ICA. Resultant 50-60% stenosis of the proximal left ICA.  2. The right common carotid artery, right internal carotid artery and bilateral vertebral arteries are patent within the neck without significant stenosis. 3. Cervical chain lymph nodes are diffusely prominent in number throughout the neck. Findings are nonspecific and clinical correlation is recommended. CTA head: No intracranial large vessel occlusion or proximal high-grade arterial stenosis. Electronically Signed   By: Jackey Loge DO   On: 11/20/2019 11:31   CT ANGIO NECK W OR WO CONTRAST  Result Date: 11/20/2019 CLINICAL DATA:  Stroke, follow-up. EXAM: CT ANGIOGRAPHY HEAD AND NECK TECHNIQUE: Multidetector CT imaging of the head and neck was performed using the standard protocol during bolus administration of intravenous contrast. Multiplanar CT image reconstructions and MIPs were obtained to evaluate the vascular anatomy. Carotid stenosis measurements (when applicable) are obtained utilizing NASCET criteria, using the distal internal carotid diameter as the denominator. CONTRAST:  75mL OMNIPAQUE IOHEXOL 350 MG/ML SOLN COMPARISON:  Brain MRI 11/20/2018 FINDINGS: CT HEAD FINDINGS Brain: Redemonstrated small acute infarct within the left occipital lobe. Additional known acute infarcts within the left parietal lobe and within the left cerebral hemisphere at the watershed zone between the left ACA and MCA territories were better appreciated on MRI performed earlier the same day. No evidence of hemorrhagic conversion.  No significant mass effect. No midline shift. No extra-axial fluid collection. Cerebral volume is normal for age. Vascular: Reported below. Skull: Normal. Negative for fracture or focal lesion. Sinuses: Small amount of frothy secretions layering within the left maxillary sinus. No significant mastoid effusion. Orbits: No acute abnormality. Review of the MIP images confirms the above findings CTA NECK FINDINGS Aortic arch: Common origin of the innominate and left common carotid arteries. The  visualized aortic arch is unremarkable. No innominate or proximal subclavian artery stenosis Right carotid system: CCA and ICA patent within the neck without stenosis. Minimal mixed plaque within the proximal ICA. Left carotid system: The CCA is patent to the bifurcation without significant stenosis (50% or greater) prominently soft plaque within the carotid bifurcation and extending into the proximal ICA. Resultant 50-60% stenosis of the proximal ICA. Distal to this, the ICA is patent within the neck without significant stenosis. Vertebral arteries: The left vertebral artery is dominant. The vertebral arteries are patent within the neck without significant stenosis Skeleton: No acute bony abnormality or aggressive osseous lesion. Other neck: No soft tissue neck mass. Cervical chain lymph nodes are prominent in number throughout the neck. Upper chest: No consolidation within the imaged lung apices. Review of the MIP images confirms the above findings CTA HEAD FINDINGS Anterior circulation: The intracranial internal carotid arteries are patent without significant stenosis. No M2 proximal branch occlusion or high-grade proximal arterial stenosis is identified. The anterior cerebral arteries are patent without high-grade proximal stenosis. No intracranial aneurysm is identified. Posterior circulation: The intracranial vertebral arteries are patent without significant stenosis, as is the basilar artery. The bilateral posterior cerebral arteries are patent proximally without high-grade stenosis. Posterior communicating arteries are hypoplastic or absent bilaterally. Venous sinuses: Within limitations of contrast timing, no convincing thrombus. Anatomic variants: As described Review of the MIP images confirms the above findings IMPRESSION: CT head: Known small acute infarcts within the left cerebral hemisphere involving the left occipital lobe, left parietal lobe and at the watershed zone between the left ACA and MCA  vascular territories, some of which were better appreciated on same-day brain MRI. No hemorrhagic conversion. No interval infarct is identified. No significant mass effect. CTA neck: 1. Predominantly soft plaque within the distal left CCA and proximal left ICA. Resultant 50-60% stenosis of the proximal left ICA. 2. The right common carotid artery, right internal carotid artery and bilateral vertebral arteries are patent within the neck without significant stenosis. 3. Cervical chain lymph nodes are diffusely prominent in number throughout the neck. Findings are nonspecific and clinical correlation is recommended. CTA head: No intracranial large vessel occlusion or proximal high-grade arterial stenosis. Electronically Signed   By: Kellie Simmering DO   On: 11/20/2019 11:31   MR BRAIN WO CONTRAST  Result Date: 11/20/2019 CLINICAL DATA:  Headache with vomiting. Right arm numbness. EXAM: MRI HEAD WITHOUT CONTRAST TECHNIQUE: Multiplanar, multiecho pulse sequences of the brain and surrounding structures were obtained without intravenous contrast. COMPARISON:  None. FINDINGS: Brain: Multiple small foci of abnormal diffusion restriction within the left hemisphere, including the left occipital lobe, left parietal periatrial white matter and at the watershed zone between the left anterior and middle cerebral arteries. No diffusion abnormality in the right hemisphere or cerebellum. Multifocal white matter hyperintensity, most commonly due to chronic ischemic microangiopathy. Normal volume of CSF spaces. No chronic microhemorrhage. Normal midline structures. Vascular: Normal flow voids. Skull and upper cervical spine: Normal marrow signal. Sinuses/Orbits: Negative. Other: None. IMPRESSION: 1. Multiple small acute infarcts within the  left hemisphere, including the left occipital lobe, left parietal lobe and at the watershed zone between the left anterior and middle cerebral arteries. 2. No hemorrhage or mass effect.  Electronically Signed   By: Deatra RobinsonKevin  Herman M.D.   On: 11/20/2019 02:19   ECHOCARDIOGRAM COMPLETE  Result Date: 11/20/2019    ECHOCARDIOGRAM REPORT   Patient Name:   Ansel BongLEEANNE T Suski Date of Exam: 11/20/2019 Medical Rec #:  [id number]       Height:       64.0 in Accession #:    [id number]      Weight:       150.0 lb Date of Birth:  03/27/1966       BSA:          1.731 m Patient Age:    53 years        BP:           168/99 mmHg Patient Gender: F               HR:           103 bpm. Exam Location:  [facility] Procedure: 2D Echo, Color Doppler and Cardiac Doppler Indications:     I163.9 Stroke  History:         Patient has no prior history of Echocardiogram examinations.                  Signs/Symptoms:Frequent headaches.  Sonographer:     Humphrey RollsJoan Heiss RDCS (AE) Referring Phys:  [id number] Andris BaumannHAZEL V DUNCAN Diagnosing Phys: Alwyn Peawayne D Callwood MD  Sonographer Comments: Suboptimal subcostal window. IMPRESSIONS  1. Left ventricular ejection fraction, by estimation, is 65 to 70%. The left ventricle has normal function. The left ventricle has no regional wall motion abnormalities. Left ventricular diastolic parameters are consistent with Grade I diastolic dysfunction (impaired relaxation).  2. Right ventricular systolic function is normal. The right ventricular size is normal.  3. The mitral valve is normal in structure. No evidence of mitral valve regurgitation.  4. The aortic valve is normal in structure. Aortic valve regurgitation is not visualized. FINDINGS  Left Ventricle: Left ventricular ejection fraction, by estimation, is 65 to 70%. The left ventricle has normal function. The left ventricle has no regional wall motion abnormalities. The left ventricular internal cavity size was normal in size. There is  no left ventricular hypertrophy. Left ventricular diastolic parameters are consistent with Grade I diastolic dysfunction (impaired relaxation). Right Ventricle: The right ventricular size is normal. No increase in right ventricular  wall thickness. Right ventricular systolic function is normal. Left Atrium: Left atrial size was normal in size. Right Atrium: Right atrial size was normal in size. Pericardium: There is no evidence of pericardial effusion. Mitral Valve: The mitral valve is normal in structure. No evidence of mitral valve regurgitation. MV peak gradient, 5.3 mmHg. The mean mitral valve gradient is 3.0 mmHg. Tricuspid Valve: The tricuspid valve is normal in structure. Tricuspid valve regurgitation is trivial. Aortic Valve: The aortic valve is normal in structure. Aortic valve regurgitation is not visualized. Aortic valve mean gradient measures 6.0 mmHg. Aortic valve peak gradient measures 11.0 mmHg. Aortic valve area, by VTI measures 1.61 cm. Pulmonic Valve: The pulmonic valve was normal in structure. Pulmonic valve regurgitation is not visualized. Aorta: The aortic root is normal in size and structure. IAS/Shunts: No atrial level shunt detected by color flow Doppler.  LEFT VENTRICLE PLAX 2D LVIDd:         4.04 cm  Diastology LVIDs:  2.43 cm  LV e' lateral:   9.36 cm/s LV PW:         0.89 cm  LV E/e' lateral: 10.6 LV IVS:        0.77 cm  LV e' medial:    8.81 cm/s LVOT diam:     1.90 cm  LV E/e' medial:  11.2 LV SV:         53 LV SV Index:   31 LVOT Area:     2.84 cm  RIGHT VENTRICLE RV Basal diam:  2.65 cm LEFT ATRIUM             Index       RIGHT ATRIUM          Index LA diam:        2.70 cm 1.56 cm/m  RA Area:     9.21 cm LA Vol (A2C):   21.7 ml 12.53 ml/m RA Volume:   16.20 ml 9.36 ml/m LA Vol (A4C):   33.6 ml 19.41 ml/m LA Biplane Vol: 27.7 ml 16.00 ml/m  AORTIC VALVE                    PULMONIC VALVE AV Area (Vmax):    1.90 cm     PV Vmax:       1.29 m/s AV Area (Vmean):   1.79 cm     PV Vmean:      81.500 cm/s AV Area (VTI):     1.61 cm     PV VTI:        0.214 m AV Vmax:           166.00 cm/s  PV Peak grad:  6.7 mmHg AV Vmean:          112.000 cm/s PV Mean grad:  3.0 mmHg AV VTI:            0.329 m AV Peak  Grad:      11.0 mmHg AV Mean Grad:      6.0 mmHg LVOT Vmax:         111.00 cm/s LVOT Vmean:        70.900 cm/s LVOT VTI:          0.187 m LVOT/AV VTI ratio: 0.57  AORTA Ao Root diam: 3.00 cm MITRAL VALVE MV Area (PHT): 6.54 cm     SHUNTS MV Peak grad:  5.3 mmHg     Systemic VTI:  0.19 m MV Mean grad:  3.0 mmHg     Systemic Diam: 1.90 cm MV Vmax:       1.15 m/s MV Vmean:      77.9 cm/s MV Decel Time: 116 msec MV E velocity: 99.00 cm/s MV A velocity: 125.00 cm/s MV E/A ratio:  0.79 Dwayne D Callwood MD Electronically signed by Alwyn Pea MD Signature Date/Time: 11/20/2019/3:56:57 PM    Final      Scheduled Meds: .  stroke: mapping our early stages of recovery book   Does not apply Once  . aspirin EC  325 mg Oral Daily  . atorvastatin  20 mg Oral q1800  . enoxaparin (LOVENOX) injection  40 mg Subcutaneous Q24H  . sodium chloride flush  3 mL Intravenous Once   Continuous Infusions:   LOS: 0 days     Darlin Priestly, MD [facility] If 7PM-7AM, please contact night-coverage 11/20/2019, 7:12 PM

## 2019-11-20 NOTE — Evaluation (Addendum)
Occupational Therapy Evaluation Patient Details Name: Samantha Powers MRN: [id number] DOB: [birth year] Today's Date: 11/20/2019    History of Present Illness MILISA KIMBELL is a 54 y.o. female brought to the ED from home via EMS with chief complaint of migraine headache and vomiting.  Awakened 11/19/19 with severe headache, nausea and vomiting for the majority of the day, as well as RUE numbness and difficulty with speech. 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   Clinical Impression   Ms. Revak was seen for OT evaluation this date. Prior to hospital admission, pt was independent in all aspects of ADL/IADL. 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 Pt lives with her spouse, adult daughter, and mother in a 1 story home with 3 steps to enter and no hand railings. Pt endorses that once inside her home, she has about 3 steps down into her bedroom. Pt presents for OT evaluation in the emergency department with her daughter present at bedside. Both pt and her daughter report pt symptoms have generally resolved, although she does continue to endorse mild headache with RN in room during session to administer medications. Pt demonstrates baseline independence to perform ADL and mobility tasks and no strength, sensory, coordination, cognitive, or visual deficits appreciated with assessment. No skilled OT needs identified. Will sign off. Please re-consult if additional OT needs arise during this admission.     Follow Up Recommendations  No OT follow up    Equipment Recommendations       Recommendations for Other Services       Precautions / Restrictions Precautions Precautions: None Restrictions Weight Bearing Restrictions: No      Mobility Bed Mobility Overal bed mobility: Modified Independent             General bed mobility comments: Pt performs sup<>sit t/f in gurney  bed x2 during session w/o physical assist. Safe use of bed rails. HOB elevated.  Transfers Overall transfer level: Independent Equipment used: None             General transfer comment: Pt performs functional mobility to/from hall bathrrom in the emergency department without need for physical assist. Pt dtr present and holds pt hand "just to be safe". No LOB appreciated with assessment.    Balance Overall balance assessment: Independent;No apparent balance deficits (not formally assessed)                                         ADL either performed or assessed with clinical judgement   ADL Overall ADL's : At baseline                                       General ADL Comments: Pt presents at or near baseline level to perform functional tasks. During OT evaluation she is observed to use BUE for self feeding. She ambulates independently to the hall bathrrom and performs toileting with out need for physical assistance. Per nsg, pt has been up ad lib throughout the day. Pt endorses initial symptoms have generally resolved.     Vision Baseline Vision/History: Wears glasses Wears Glasses: Reading only Patient Visual Report: No change from baseline Vision Assessment?: No apparent visual deficits;Yes  Eye Alignment: Within Functional Limits Ocular Range of Motion: Within Functional Limits Alignment/Gaze Preference: Within Defined Limits Tracking/Visual Pursuits: Able to track stimulus in all quads without difficulty Saccades: Within functional limits Visual Fields: No apparent deficits     Perception     Praxis      Pertinent Vitals/Pain Pain Assessment: Faces Faces Pain Scale: Hurts a little bit Pain Location: Headache Pain Descriptors / Indicators: Aching;Headache;Sore Pain Intervention(s): Limited activity within patient's tolerance;Monitored during session;RN gave pain meds during session     Hand Dominance Right   Extremity/Trunk  Assessment Upper Extremity Assessment Upper Extremity Assessment: Overall WFL for tasks assessed(BUE WNL. Grossly 5/5 t/o shoulder, elbow flex/ext, supination/pronation. Pt denies ongoing weakness, numbness, or tingling. Sensation and [facility] WNL.)   Lower Extremity Assessment Lower Extremity Assessment: Overall WFL for tasks assessed   Cervical / Trunk Assessment Cervical / Trunk Assessment: Normal   Communication Communication Communication: No difficulties   Cognition Arousal/Alertness: Awake/alert Behavior During Therapy: Flat affect Overall Cognitive Status: Within Functional Limits for tasks assessed                                 General Comments: Pt pleasant, conversational, endorses mild pain from headache with photo sensitivity.   General Comments       Exercises Other Exercises Other Exercises: Pt educated on role of OT in acute care setting, falls prevention strategies, and safe transfer technique this date. OT provides supervision for safety as pt ambulates to/from hall bathroom and performs toileting this date.   Shoulder Instructions      Home Living Family/patient expects to be discharged to:: Private residence Living Arrangements: Spouse/significant other;Children;Parent(Pt lives with her husband, one of her adult daughters, and her mother.) Available Help at Discharge: Family;Available 24 hours/day Type of Home: House Home Access: Stairs to enter [employer] of Steps: 3 Entrance Stairs-Rails: None Home Layout: One level;Other (Comment)(Pt bedroom is in finished garage which has ~3 steps down to enter. Bathroom is on main level of the home.)     Bathroom Shower/Tub: Tub/shower unit;Curtain   Bathroom Toilet: Standard     Home Equipment: None          Prior Functioning/Environment Level of Independence: Independent        Comments: Pt is active and independent. 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 Denies use of AD for functional mobility.        OT Problem List: Pain;Decreased safety awareness;Impaired UE functional use;Decreased knowledge of use of DME or AE      OT Treatment/Interventions:      OT Goals(Current goals can be found in the care plan section) Acute Rehab OT Goals Patient Stated Goal: To to home OT Goal Formulation: All assessment and education complete, DC therapy Time For Goal Achievement: 11/20/19 Potential to Achieve Goals: Good  OT Frequency:     Barriers to D/C:            Co-evaluation              AM-PAC OT "6 Clicks" Daily Activity     Outcome Measure Help from another person eating meals?: None Help from another person taking care of personal grooming?: None Help from another person toileting, which includes using toliet, bedpan, or urinal?: None Help from another person bathing (including washing, rinsing, drying)?: None Help from another person to put on and taking  off regular upper body clothing?: None Help from another person to put on and taking off regular lower body clothing?: None 6 Click Score: 24   End of Session    Activity Tolerance: Patient tolerated treatment well Patient left: in bed;with call bell/phone within reach;with family/visitor present(In gurney bed)  OT Visit Diagnosis: Other abnormalities of gait and mobility (R26.89);Other symptoms and signs involving the nervous system (R29.898)                Time: 4235-3614 OT Time Calculation (min): 32 min Charges:  OT General Charges $OT Visit: 1 Visit OT Evaluation $OT Eval Moderate Complexity: 1 Mod OT Treatments $Self Care/Home Management : 8-22 mins  Rockney Ghee, M.S., OTR/L Ascom: [phone] 11/20/19, 3:41 PM

## 2019-11-21 LAB — CBC
HCT: 43.3 % (ref 36.0–46.0)
Hemoglobin: 14.6 g/dL (ref 12.0–15.0)
MCH: 29.1 pg (ref 26.0–34.0)
MCHC: 33.7 g/dL (ref 30.0–36.0)
MCV: 86.3 fL (ref 80.0–100.0)
Platelets: 366 10*3/uL (ref 150–400)
RBC: 5.02 MIL/uL (ref 3.87–5.11)
RDW: 13 % (ref 11.5–15.5)
WBC: 10.6 10*3/uL — ABNORMAL HIGH (ref 4.0–10.5)
nRBC: 0 % (ref 0.0–0.2)

## 2019-11-21 LAB — CARDIOLIPIN ANTIBODIES, IGG, IGM, IGA
Anticardiolipin IgA: <9 APL U/mL (ref 0–11)
Anticardiolipin IgG: <9 GPL U/mL (ref 0–14)
Anticardiolipin IgM: 14 MPL U/mL — ABNORMAL HIGH (ref 0–12)

## 2019-11-21 LAB — BASIC METABOLIC PANEL
Anion gap: 9 (ref 5–15)
BUN: 16 mg/dL (ref 6–20)
CO2: 24 mmol/L (ref 22–32)
Calcium: 9.1 mg/dL (ref 8.9–10.3)
Chloride: 105 mmol/L (ref 98–111)
Creatinine, Ser: 0.58 mg/dL (ref 0.44–1.00)
GFR calc Af Amer: >60 mL/min (ref >60)
GFR calc non Af Amer: >60 mL/min (ref >60)
Glucose, Bld: 103 mg/dL — ABNORMAL HIGH (ref 70–99)
Potassium: 3.7 mmol/L (ref 3.5–5.1)
Sodium: 138 mmol/L (ref 135–145)

## 2019-11-21 LAB — HOMOCYSTEINE: Homocysteine: 8.1 umol/L (ref 0.0–14.5)

## 2019-11-21 LAB — MAGNESIUM: Magnesium: 2.3 mg/dL (ref 1.7–2.4)

## 2019-11-21 MED ORDER — ATORVASTATIN CALCIUM 40 MG PO TABS: 40.0000 mg | ORAL_TABLET | Freq: Every day | ORAL | 2 refills | Status: DC

## 2019-11-21 MED ORDER — CLOPIDOGREL BISULFATE 75 MG PO TABS: 75.0000 mg | ORAL_TABLET | Freq: Every day | ORAL | 0 refills | Status: DC

## 2019-11-21 MED ORDER — STROKE: EARLY STAGES OF RECOVERY BOOK: 1.0000 | Freq: Once | 0 refills | Status: AC

## 2019-11-21 MED ORDER — ASPIRIN 81 MG PO TBEC: 81.0000 mg | DELAYED_RELEASE_TABLET | Freq: Every day | ORAL | Status: AC

## 2019-11-21 MED ORDER — BUTALBITAL-APAP-CAFFEINE 50-325-40 MG PO TABS: 1.0000 | ORAL_TABLET | Freq: Two times a day (BID) | ORAL | 0 refills | Status: AC | PRN

## 2019-11-21 NOTE — TOC Progression Note (Signed)
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

## 2019-11-21 NOTE — Discharge Summary (Signed)
Physician Discharge Summary   Samantha Powers  female DOB: [id number]  VFI:[id number]  PCP: Patient, No Pcp Per  Admit date: 11/19/2019 Discharge date: 11/21/2019  Admitted From: home Disposition:  Home.  Husband and daughter updated at bedside prior to discharge. CODE STATUS: Full code  Discharge Instructions    Ambulatory referral to Ophthalmology   Complete by: As directed    Pt had a stroke, and neurologist Dr. Thad Ranger asks for pt to see by ophthalmology.   Diet - low sodium heart healthy   Complete by: As directed    Discharge instructions   Complete by: As directed    Please take ASA 81 and Plavix 75 mg daily until you see neurologist as outpatient.    Please take Lipitor 40 mg daily and have your PCP recheck your cholesterol about 3 months from now.  Ophthalmology referral has been ordered.    Cardiologist will schedule an appointment for you to be seen in clinic next week to arrange TEE.  Neurology appointment has been scheduled.   Dr. Darlin Priestly - -   Increase activity slowly   Complete by: As directed        Hospital Course:  For full details, please see H&P, progress notes, consult notes and ancillary notes.  Briefly,  Samantha Powers a 54 y.o.Caucasian femalewith medical history significant forlongstanding history of migraines, self diagnosed with no prior neurology evaluation who presented to the emergency room with a 1 day history of headache and vomiting, typical for her migraines at this time associated withnumbness in bilateral hands, since resolved.    Acute CVA (cerebrovascular accident) [facility])  MRI showing multiple small acute infarcts left hemisphere, likely embolic etiology, per neuro.  NIH stroke score was 0 her symptoms had completely resolved.  LDL 167, A1c 5.7.  CTA showed "Predominantly soft plaque within the distal left CCA and proximal left ICA. Resultant 50-60% stenosis of the proximal left ICA."  No significant stenosis in the  common carotids.  Echocardiogramwith bubble study showed no shunt and no cardiac source of emboli.  Neuro recommended Prophylactic therapy-Dual antiplatelet therapy with ASA 81mg  and Plavix 75mg  for three weeks with change toASA 81mg alone as monotherapy after that time.  Lipitor increased to 40 mg daily.    Pt was scheduled outpatient cardiology appointment to arrange for TEE, per Neuro rec.  If TEE unremarkable patient should have prolonged cardiac monitoring.  Pt was also ordered hypercoag panel which was pending, to be followed up with outpatient neurology (scheduled prior to discharge.)  Prior to discharge, pt mentioned some right eye visual changes, so referral was made to Ophthalmology as outpatient.  Acute headache Pt self reported a hx of migraine.  Has not been prescribed migraine medication in the past.  Headache improved with droperidol (restricted to ED use) and Zofran in the emergency room.  Pt was prescribed FIORICET to try at home, and will follow up with neurology for future headache management.   Discharge Diagnoses:  Active Problems:   Acute CVA (cerebrovascular accident) [facility])   Acute intractable headache   CVA (cerebral vascular accident) [facility])    Discharge Instructions:  Allergies as of 11/21/2019      Reactions   Penicillins       Medication List    TAKE these medications    stroke: mapping our early stages of recovery book Misc 1 each by Does not apply route once for 1 dose.   aspirin 81 MG EC tablet Take 1 tablet (  81 mg total) by mouth daily. Start taking on: November 22, 2019   atorvastatin 40 MG tablet Commonly known as: LIPITOR Take 1 tablet (40 mg total) by mouth daily at 6 PM.   butalbital-acetaminophen-caffeine 50-325-40 MG tablet Commonly known as: FIORICET Take 1-2 tablets by mouth every 12 (twelve) hours as needed for headache.   clopidogrel 75 MG tablet Commonly known as: PLAVIX Take 1 tablet (75 mg total) by mouth daily. Start taking on:  November 22, 2019   ibuprofen 200 MG tablet Commonly known as: ADVIL Take 200 mg by mouth every 6 (six) hours as needed for headache.       Follow-up Information    Lonell Face, MD Follow up on 11/26/2019.   Specialty: Neurology Why: appointment time 8:15 am. Contact information: [address] [facility] [place] [address] [phone]        End, Cristal Deer, MD Follow up.   Specialty: Cardiology Why: Dr. Serita Kyle office will call you to schedule an appointment for next week. Contact information: [address] [place] [address] [phone]           Allergies  Allergen Reactions  . Penicillins      The results of significant diagnostics from this hospitalization (including imaging, microbiology, ancillary and laboratory) are listed below for reference.   Consultations:   Procedures/Studies: CT ANGIO HEAD W OR WO CONTRAST  Result Date: 11/20/2019 CLINICAL DATA:  Stroke, follow-up. EXAM: CT ANGIOGRAPHY HEAD AND NECK TECHNIQUE: Multidetector CT imaging of the head and neck was performed using the standard protocol during bolus administration of intravenous contrast. Multiplanar CT image reconstructions and MIPs were obtained to evaluate the vascular anatomy. Carotid stenosis measurements (when applicable) are obtained utilizing NASCET criteria, using the distal internal carotid diameter as the denominator. CONTRAST:  5mL OMNIPAQUE IOHEXOL 350 MG/ML SOLN COMPARISON:  Brain MRI 11/20/2018 FINDINGS: CT HEAD FINDINGS Brain: Redemonstrated small acute infarct within the left occipital lobe. Additional known acute infarcts within the left parietal lobe and within the left cerebral hemisphere at the watershed zone between the left ACA and MCA territories were better appreciated on MRI performed earlier the same day. No evidence of hemorrhagic conversion. No significant mass effect. No midline shift. No extra-axial fluid  collection. Cerebral volume is normal for age. Vascular: Reported below. Skull: Normal. Negative for fracture or focal lesion. Sinuses: Small amount of frothy secretions layering within the left maxillary sinus. No significant mastoid effusion. Orbits: No acute abnormality. Review of the MIP images confirms the above findings CTA NECK FINDINGS Aortic arch: Common origin of the innominate and left common carotid arteries. The visualized aortic arch is unremarkable. No innominate or proximal subclavian artery stenosis Right carotid system: CCA and ICA patent within the neck without stenosis. Minimal mixed plaque within the proximal ICA. Left carotid system: The CCA is patent to the bifurcation without significant stenosis (50% or greater) prominently soft plaque within the carotid bifurcation and extending into the proximal ICA. Resultant 50-60% stenosis of the proximal ICA. Distal to this, the ICA is patent within the neck without significant stenosis. Vertebral arteries: The left vertebral artery is dominant. The vertebral arteries are patent within the neck without significant stenosis Skeleton: No acute bony abnormality or aggressive osseous lesion. Other neck: No soft tissue neck mass. Cervical chain lymph nodes are prominent in number throughout the neck. Upper chest: No consolidation within the imaged lung apices. Review of the MIP images confirms the above findings CTA HEAD FINDINGS  Anterior circulation: The intracranial internal carotid arteries are patent without significant stenosis. No M2 proximal branch occlusion or high-grade proximal arterial stenosis is identified. The anterior cerebral arteries are patent without high-grade proximal stenosis. No intracranial aneurysm is identified. Posterior circulation: The intracranial vertebral arteries are patent without significant stenosis, as is the basilar artery. The bilateral posterior cerebral arteries are patent proximally without high-grade stenosis.  Posterior communicating arteries are hypoplastic or absent bilaterally. Venous sinuses: Within limitations of contrast timing, no convincing thrombus. Anatomic variants: As described Review of the MIP images confirms the above findings IMPRESSION: CT head: Known small acute infarcts within the left cerebral hemisphere involving the left occipital lobe, left parietal lobe and at the watershed zone between the left ACA and MCA vascular territories, some of which were better appreciated on same-day brain MRI. No hemorrhagic conversion. No interval infarct is identified. No significant mass effect. CTA neck: 1. Predominantly soft plaque within the distal left CCA and proximal left ICA. Resultant 50-60% stenosis of the proximal left ICA. 2. The right common carotid artery, right internal carotid artery and bilateral vertebral arteries are patent within the neck without significant stenosis. 3. Cervical chain lymph nodes are diffusely prominent in number throughout the neck. Findings are nonspecific and clinical correlation is recommended. CTA head: No intracranial large vessel occlusion or proximal high-grade arterial stenosis. Electronically Signed   By: Jackey LogeKyle  Golden DO   On: 11/20/2019 11:31   CT ANGIO NECK W OR WO CONTRAST  Result Date: 11/20/2019 CLINICAL DATA:  Stroke, follow-up. EXAM: CT ANGIOGRAPHY HEAD AND NECK TECHNIQUE: Multidetector CT imaging of the head and neck was performed using the standard protocol during bolus administration of intravenous contrast. Multiplanar CT image reconstructions and MIPs were obtained to evaluate the vascular anatomy. Carotid stenosis measurements (when applicable) are obtained utilizing NASCET criteria, using the distal internal carotid diameter as the denominator. CONTRAST:  75mL OMNIPAQUE IOHEXOL 350 MG/ML SOLN COMPARISON:  Brain MRI 11/20/2018 FINDINGS: CT HEAD FINDINGS Brain: Redemonstrated small acute infarct within the left occipital lobe. Additional known acute  infarcts within the left parietal lobe and within the left cerebral hemisphere at the watershed zone between the left ACA and MCA territories were better appreciated on MRI performed earlier the same day. No evidence of hemorrhagic conversion. No significant mass effect. No midline shift. No extra-axial fluid collection. Cerebral volume is normal for age. Vascular: Reported below. Skull: Normal. Negative for fracture or focal lesion. Sinuses: Small amount of frothy secretions layering within the left maxillary sinus. No significant mastoid effusion. Orbits: No acute abnormality. Review of the MIP images confirms the above findings CTA NECK FINDINGS Aortic arch: Common origin of the innominate and left common carotid arteries. The visualized aortic arch is unremarkable. No innominate or proximal subclavian artery stenosis Right carotid system: CCA and ICA patent within the neck without stenosis. Minimal mixed plaque within the proximal ICA. Left carotid system: The CCA is patent to the bifurcation without significant stenosis (50% or greater) prominently soft plaque within the carotid bifurcation and extending into the proximal ICA. Resultant 50-60% stenosis of the proximal ICA. Distal to this, the ICA is patent within the neck without significant stenosis. Vertebral arteries: The left vertebral artery is dominant. The vertebral arteries are patent within the neck without significant stenosis Skeleton: No acute bony abnormality or aggressive osseous lesion. Other neck: No soft tissue neck mass. Cervical chain lymph nodes are prominent in number throughout the neck. Upper chest: No consolidation within the imaged lung apices. Review  of the MIP images confirms the above findings CTA HEAD FINDINGS Anterior circulation: The intracranial internal carotid arteries are patent without significant stenosis. No M2 proximal branch occlusion or high-grade proximal arterial stenosis is identified. The anterior cerebral arteries  are patent without high-grade proximal stenosis. No intracranial aneurysm is identified. Posterior circulation: The intracranial vertebral arteries are patent without significant stenosis, as is the basilar artery. The bilateral posterior cerebral arteries are patent proximally without high-grade stenosis. Posterior communicating arteries are hypoplastic or absent bilaterally. Venous sinuses: Within limitations of contrast timing, no convincing thrombus. Anatomic variants: As described Review of the MIP images confirms the above findings IMPRESSION: CT head: Known small acute infarcts within the left cerebral hemisphere involving the left occipital lobe, left parietal lobe and at the watershed zone between the left ACA and MCA vascular territories, some of which were better appreciated on same-day brain MRI. No hemorrhagic conversion. No interval infarct is identified. No significant mass effect. CTA neck: 1. Predominantly soft plaque within the distal left CCA and proximal left ICA. Resultant 50-60% stenosis of the proximal left ICA. 2. The right common carotid artery, right internal carotid artery and bilateral vertebral arteries are patent within the neck without significant stenosis. 3. Cervical chain lymph nodes are diffusely prominent in number throughout the neck. Findings are nonspecific and clinical correlation is recommended. CTA head: No intracranial large vessel occlusion or proximal high-grade arterial stenosis. Electronically Signed   By: Jackey Loge DO   On: 11/20/2019 11:31   MR BRAIN WO CONTRAST  Result Date: 11/20/2019 CLINICAL DATA:  Headache with vomiting. Right arm numbness. EXAM: MRI HEAD WITHOUT CONTRAST TECHNIQUE: Multiplanar, multiecho pulse sequences of the brain and surrounding structures were obtained without intravenous contrast. COMPARISON:  None. FINDINGS: Brain: Multiple small foci of abnormal diffusion restriction within the left hemisphere, including the left occipital lobe,  left parietal periatrial white matter and at the watershed zone between the left anterior and middle cerebral arteries. No diffusion abnormality in the right hemisphere or cerebellum. Multifocal white matter hyperintensity, most commonly due to chronic ischemic microangiopathy. Normal volume of CSF spaces. No chronic microhemorrhage. Normal midline structures. Vascular: Normal flow voids. Skull and upper cervical spine: Normal marrow signal. Sinuses/Orbits: Negative. Other: None. IMPRESSION: 1. 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 2. No hemorrhage or mass effect. Electronically Signed   By: Deatra Robinson M.D.   On: 11/20/2019 02:19   ECHOCARDIOGRAM COMPLETE  Result Date: 11/20/2019    ECHOCARDIOGRAM REPORT   Patient Name:   Samantha Powers Date of Exam: 11/20/2019 Medical Rec #:  [id number]       Height:       64.0 in Accession #:    [id number]      Weight:       150.0 lb Date of Birth:  01/20/1966       BSA:          1.731 m Patient Age:    53 years        BP:           168/99 mmHg Patient Gender: F               HR:           103 bpm. Exam Location:  [facility] Procedure: 2D Echo, Color Doppler and Cardiac Doppler Indications:     I163.9 Stroke  History:         Patient has no prior history of Echocardiogram examinations.                  Signs/Symptoms:Frequent headaches.  Sonographer:     Charmayne Sheer RDCS (AE) Referring Phys:  [id number] Athena Masse Diagnosing Phys: Yolonda Kida MD  Sonographer Comments: Suboptimal subcostal window. IMPRESSIONS  1. Left ventricular ejection fraction, by estimation, is 65 to 70%. The left ventricle has normal function. The left ventricle has no regional wall motion abnormalities. Left ventricular diastolic parameters are consistent with Grade I diastolic dysfunction (impaired relaxation).  2. Right ventricular systolic  function is normal. The right ventricular size is normal.  3. The mitral valve is normal in structure. No evidence of mitral valve regurgitation.  4. The aortic valve is normal in structure. Aortic valve regurgitation is not visualized. FINDINGS  Left Ventricle: Left ventricular ejection fraction, by estimation, is 65 to 70%. The left ventricle has normal function. The left ventricle has no regional wall motion abnormalities. The left ventricular internal cavity size was normal in size. There is  no left ventricular hypertrophy. Left ventricular diastolic parameters are consistent with Grade I diastolic dysfunction (impaired relaxation). Right Ventricle: The right ventricular size is normal. No increase in right ventricular wall thickness. Right ventricular systolic function is normal. Left Atrium: Left atrial size was normal in size. Right Atrium: Right atrial size was normal in size. Pericardium: There is no evidence of pericardial effusion. Mitral Valve: The mitral valve is normal in structure. No evidence of mitral valve regurgitation. MV peak gradient, 5.3 mmHg. The mean mitral valve gradient is 3.0 mmHg. Tricuspid Valve: The tricuspid valve is normal in structure. Tricuspid valve regurgitation is trivial. Aortic Valve: The aortic valve is normal in structure. Aortic valve regurgitation is not visualized. Aortic valve mean gradient measures 6.0 mmHg. Aortic valve peak gradient measures 11.0 mmHg. Aortic valve area, by VTI measures 1.61 cm. Pulmonic Valve: The pulmonic valve was normal in structure. Pulmonic valve regurgitation is not visualized. Aorta: The aortic root is normal in size and structure. IAS/Shunts: No atrial level shunt detected by color flow Doppler.  LEFT VENTRICLE PLAX 2D LVIDd:         4.04 cm  Diastology LVIDs:         2.43 cm  LV e' lateral:   9.36 cm/s LV PW:         0.89 cm  LV E/e' lateral: 10.6 LV IVS:        0.77 cm  LV e' medial:    8.81 cm/s LVOT diam:     1.90 cm  LV E/e' medial:   11.2 LV SV:         53 LV SV Index:   31 LVOT Area:     2.84 cm  RIGHT VENTRICLE RV Basal diam:  2.65 cm LEFT ATRIUM             Index       RIGHT ATRIUM          Index LA diam:        2.70 cm 1.56 cm/m  RA Area:     9.21 cm LA Vol (A2C):   21.7 ml 12.53 ml/m RA Volume:   16.20 ml 9.36 ml/m LA Vol (A4C):   33.6 ml 19.41 ml/m LA Biplane Vol: 27.7 ml 16.00 ml/m  AORTIC VALVE  PULMONIC VALVE AV Area (Vmax):    1.90 cm     PV Vmax:       1.29 m/s AV Area (Vmean):   1.79 cm     PV Vmean:      81.500 cm/s AV Area (VTI):     1.61 cm     PV VTI:        0.214 m AV Vmax:           166.00 cm/s  PV Peak grad:  6.7 mmHg AV Vmean:          112.000 cm/s PV Mean grad:  3.0 mmHg AV VTI:            0.329 m AV Peak Grad:      11.0 mmHg AV Mean Grad:      6.0 mmHg LVOT Vmax:         111.00 cm/s LVOT Vmean:        70.900 cm/s LVOT VTI:          0.187 m LVOT/AV VTI ratio: 0.57  AORTA Ao Root diam: 3.00 cm MITRAL VALVE MV Area (PHT): 6.54 cm     SHUNTS MV Peak grad:  5.3 mmHg     Systemic VTI:  0.19 m MV Mean grad:  3.0 mmHg     Systemic Diam: 1.90 cm MV Vmax:       1.15 m/s MV Vmean:      77.9 cm/s MV Decel Time: 116 msec MV E velocity: 99.00 cm/s MV A velocity: 125.00 cm/s MV E/A ratio:  0.79 Dwayne D Callwood MD Electronically signed by Alwyn Pea MD Signature Date/Time: 11/20/2019/3:56:57 PM    Final       Labs: BNP (last 3 results) No results for input(s): BNP in the last 8760 hours. Basic Metabolic Panel: Recent Labs  Lab 11/19/19 2254 11/20/19 0337 11/21/19 0521  NA 138  --  138  K 3.7  --  3.7  CL 105  --  105  CO2 21*  --  24  GLUCOSE 133*  --  103*  BUN 16  --  16  CREATININE 0.68 0.64 0.58  CALCIUM 9.4  --  9.1  MG  --   --  2.3   Liver Function Tests: Recent Labs  Lab 11/19/19 2254  AST 33  ALT 39  ALKPHOS 98  BILITOT 0.9  PROT 8.5*  ALBUMIN 4.6   Recent Labs  Lab 11/19/19 2254  LIPASE 28   No results for input(s): AMMONIA in the last 168  hours. CBC: Recent Labs  Lab 11/19/19 2254 11/21/19 0521  WBC 13.8* 10.6*  HGB 15.7* 14.6  HCT 45.3 43.3  MCV 84.5 86.3  PLT 397 366   Cardiac Enzymes: No results for input(s): CKTOTAL, CKMB, CKMBINDEX, TROPONINI in the last 168 hours. BNP: Invalid input(s): POCBNP CBG: No results for input(s): GLUCAP in the last 168 hours. D-Dimer No results for input(s): DDIMER in the last 72 hours. Hgb A1c Recent Labs    11/19/19 2254  HGBA1C 5.7*   Lipid Profile Recent Labs    11/19/19 2254  CHOL 261*  HDL 46  LDLCALC 167*  TRIG 240*  CHOLHDL 5.7   Thyroid function studies No results for input(s): TSH, T4TOTAL, T3FREE, THYROIDAB in the last 72 hours.  Invalid input(s): FREET3 Anemia work up No results for input(s): VITAMINB12, FOLATE, FERRITIN, TIBC, IRON, RETICCTPCT in the last 72 hours. Urinalysis    Component Value Date/Time   COLORURINE YELLOW (  A) 11/20/2019 0337   APPEARANCEUR CLEAR (A) 11/20/2019 0337   LABSPEC 1.015 11/20/2019 0337   PHURINE 6.0 11/20/2019 0337   GLUCOSEU NEGATIVE 11/20/2019 0337   HGBUR NEGATIVE 11/20/2019 0337   BILIRUBINUR NEGATIVE 11/20/2019 0337   KETONESUR 20 (A) 11/20/2019 0337   PROTEINUR NEGATIVE 11/20/2019 0337   NITRITE NEGATIVE 11/20/2019 0337   LEUKOCYTESUR TRACE (A) 11/20/2019 0337   Sepsis Labs Invalid input(s): PROCALCITONIN,  WBC,  LACTICIDVEN Microbiology Recent Results (from the past 240 hour(s))  SARS CORONAVIRUS 2 (TAT 6-24 HRS) Nasopharyngeal Nasopharyngeal Swab     Status: None   Collection Time: 11/20/19  3:16 AM   Specimen: Nasopharyngeal Swab  Result Value Ref Range Status   SARS Coronavirus 2 NEGATIVE NEGATIVE Final    Comment: (NOTE) SARS-CoV-2 target nucleic acids are NOT DETECTED. The SARS-CoV-2 RNA is generally detectable in upper and lower respiratory specimens during the acute phase of infection. Negative results do not preclude SARS-CoV-2 infection, do not rule out co-infections with other pathogens,  and should not be used as the sole basis for treatment or other patient management decisions. Negative results must be combined with clinical observations, patient history, and epidemiological information. The expected result is Negative. Fact Sheet for Patients: HairSlick.no Fact Sheet for Healthcare Providers: quierodirigir.com This test is not yet approved or cleared by the [place] FDA and  has been authorized for detection and/or diagnosis of SARS-CoV-2 by FDA under an Emergency Use Authorization (EUA). This EUA will remain  in effect (meaning this test can be used) for the duration of the COVID-19 declaration under Section 56 4(b)(1) of the Act, 21 U.S.C. section 360bbb-3(b)(1), unless the authorization is terminated or revoked sooner. Performed at [facility], [address], [place], [place] [address]      Total time spend on discharging this patient, including the last patient exam, discussing the hospital stay, instructions for ongoing care as it relates to all pertinent caregivers, as well as preparing the medical discharge records, prescriptions, and/or referrals as applicable, is 50 minutes.    Darlin Priestly, MD  [facility] 11/21/2019, 11:53 AM  If 7PM-7AM, please contact night-coverage

## 2019-11-21 NOTE — Progress Notes (Signed)
Patient discharged per MD order. All discharge instructions given and all questions answered. Stroke booklet given to patient.

## 2019-11-21 NOTE — Progress Notes (Signed)
Subjective: Patient reports a bright light sensation in her right eye today and on further conversation reports that the day prior to her headache she had some right eye visual changes that she just ignored.    Objective: Current vital signs: BP 140/88   Pulse 89   Temp 98.1 F (36.7 C) (Oral)   Resp 16   Ht  (1.626 m)   Wt 68 kg   SpO2 97%   BMI 25.75 kg/m  Vital signs in last 24 hours: Temp:  [97.8 F (36.6 C)-98.5 F (36.9 C)] 98.1 F (36.7 C) (03/19 0747) Pulse Rate:  [76-96] 89 (03/19 0836) Resp:  [12-19] 16 (03/19 0836) BP: (119-170)/(72-100) 140/88 (03/19 0836) SpO2:  [95 %-98 %] 97 % (03/19 0836)  Intake/Output from previous day: No intake/output data recorded. Intake/Output this shift: Total I/O In: 120 [P.O.:120] Out: -  Nutritional status:  Diet Order            Diet regular Room service appropriate? Yes; Fluid consistency: Thin  Diet effective now              Neurologic Exam: Mental Status: Alert, oriented, thought content appropriate.  Speech fluent without evidence of aphasia.  Able to follow 3 step commands without difficulty. Cranial Nerves: II: Discs flat bilaterally; Visual fields grossly normal, pupils equal, round, reactive to light and accommodation III,IV, VI: ptosis not present, extra-ocular motions intact bilaterally V,VII: smile symmetric, facial light touch sensation normal bilaterally VIII: hearing normal bilaterally IX,X: gag reflex present XI: bilateral shoulder shrug XII: midline tongue extension Motor: 5/5 throughout Sensory: Pinprick and light touch intact throughout, bilaterally Cerebellar: Normal finger-to-nose and normal heel-to-shin testing bilaterally Gait: normal gait and statin  Lab Results: Basic Metabolic Panel: Recent Labs  Lab 11/19/19 2254 11/20/19 0337 11/21/19 0521  NA 138  --  138  K 3.7  --  3.7  CL 105  --  105  CO2 21*  --  24  GLUCOSE 133*  --  103*  BUN 16  --  16  CREATININE 0.68 0.64  0.58  CALCIUM 9.4  --  9.1  MG  --   --  2.3    Liver Function Tests: Recent Labs  Lab 11/19/19 2254  AST 33  ALT 39  ALKPHOS 98  BILITOT 0.9  PROT 8.5*  ALBUMIN 4.6   Recent Labs  Lab 11/19/19 2254  LIPASE 28   No results for input(s): AMMONIA in the last 168 hours.  CBC: Recent Labs  Lab 11/19/19 2254 11/21/19 0521  WBC 13.8* 10.6*  HGB 15.7* 14.6  HCT 45.3 43.3  MCV 84.5 86.3  PLT 397 366    Cardiac Enzymes: No results for input(s): CKTOTAL, CKMB, CKMBINDEX, TROPONINI in the last 168 hours.  Lipid Panel: Recent Labs  Lab 11/19/19 2254  CHOL 261*  TRIG 240*  HDL 46  CHOLHDL 5.7  VLDL 48*  LDLCALC 167*    CBG: No results for input(s): GLUCAP in the last 168 hours.  Microbiology: Results for orders placed or performed during the hospital encounter of 11/19/19  SARS CORONAVIRUS 2 (TAT 6-24 HRS) Nasopharyngeal Nasopharyngeal Swab     Status: None   Collection Time: 11/20/19  3:16 AM   Specimen: Nasopharyngeal Swab  Result Value Ref Range Status   SARS Coronavirus 2 NEGATIVE NEGATIVE Final    Comment: (NOTE) SARS-CoV-2 target nucleic acids are NOT DETECTED. The SARS-CoV-2 RNA is generally detectable in upper and lower respiratory specimens during the acute phase  of infection. Negative results do not preclude SARS-CoV-2 infection, do not rule out co-infections with other pathogens, and should not be used as the sole basis for treatment or other patient management decisions. Negative results must be combined with clinical observations, patient history, and epidemiological information. The expected result is Negative. Fact Sheet for Patients: HairSlick.no Fact Sheet for Healthcare Providers: quierodirigir.com This test is not yet approved or cleared by the [place] FDA and  has been authorized for detection and/or diagnosis of SARS-CoV-2 by FDA under an Emergency Use Authorization (EUA).  This EUA will remain  in effect (meaning this test can be used) for the duration of the COVID-19 declaration under Section 56 4(b)(1) of the Act, 21 U.S.C. section 360bbb-3(b)(1), unless the authorization is terminated or revoked sooner. Performed at [facility], [address] [place], [place] [address]     Coagulation Studies: Recent Labs    11/20/19 1123  LABPROT 13.8  INR 1.1    Imaging: CT ANGIO HEAD W OR WO CONTRAST  Result Date: 11/20/2019 CLINICAL DATA:  Stroke, follow-up. EXAM: CT ANGIOGRAPHY HEAD AND NECK TECHNIQUE: Multidetector CT imaging of the head and neck was performed using the standard protocol during bolus administration of intravenous contrast. Multiplanar CT image reconstructions and MIPs were obtained to evaluate the vascular anatomy. Carotid stenosis measurements (when applicable) are obtained utilizing NASCET criteria, using the distal internal carotid diameter as the denominator. CONTRAST:  56mL OMNIPAQUE IOHEXOL 350 MG/ML SOLN COMPARISON:  Brain MRI 11/20/2018 FINDINGS: CT HEAD FINDINGS Brain: Redemonstrated small acute infarct within the left occipital lobe. Additional known acute infarcts within the left parietal lobe and within the left cerebral hemisphere at the watershed zone between the left ACA and MCA territories were better appreciated on MRI performed earlier the same day. No evidence of hemorrhagic conversion. No significant mass effect. No midline shift. No extra-axial fluid collection. Cerebral volume is normal for age. Vascular: Reported below. Skull: Normal. Negative for fracture or focal lesion. Sinuses: Small amount of frothy secretions layering within the left maxillary sinus. No significant mastoid effusion. Orbits: No acute abnormality. Review of the MIP images confirms the above findings CTA NECK FINDINGS Aortic arch: Common origin of the innominate and left common carotid arteries. The visualized aortic arch is unremarkable. No innominate  or proximal subclavian artery stenosis Right carotid system: CCA and ICA patent within the neck without stenosis. Minimal mixed plaque within the proximal ICA. Left carotid system: The CCA is patent to the bifurcation without significant stenosis (50% or greater) prominently soft plaque within the carotid bifurcation and extending into the proximal ICA. Resultant 50-60% stenosis of the proximal ICA. Distal to this, the ICA is patent within the neck without significant stenosis. Vertebral arteries: The left vertebral artery is dominant. The vertebral arteries are patent within the neck without significant stenosis Skeleton: No acute bony abnormality or aggressive osseous lesion. Other neck: No soft tissue neck mass. Cervical chain lymph nodes are prominent in number throughout the neck. Upper chest: No consolidation within the imaged lung apices. Review of the MIP images confirms the above findings CTA HEAD FINDINGS Anterior circulation: The intracranial internal carotid arteries are patent without significant stenosis. No M2 proximal branch occlusion or high-grade proximal arterial stenosis is identified. The anterior cerebral arteries are patent without high-grade proximal stenosis. No intracranial aneurysm is identified. Posterior circulation: The intracranial vertebral arteries are patent without significant stenosis, as is the basilar artery. The bilateral posterior cerebral arteries are patent proximally without high-grade stenosis.  Posterior communicating arteries are hypoplastic or absent bilaterally. Venous sinuses: Within limitations of contrast timing, no convincing thrombus. 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 No hemorrhagic conversion. No interval infarct is identified. No significant mass effect. CTA neck: 1. Predominantly soft plaque within the distal left CCA and proximal left ICA. Resultant 50-60% stenosis of the proximal left ICA. 2. The right common carotid artery, right internal carotid artery and bilateral vertebral arteries are patent within the neck without significant stenosis. 3. Cervical chain lymph nodes are diffusely prominent in number throughout the neck. Findings are nonspecific and clinical correlation is recommended. CTA head: No intracranial large vessel occlusion or proximal high-grade arterial stenosis. Electronically Signed   By: Jackey Loge DO   On: 11/20/2019 11:31   CT ANGIO NECK W OR WO CONTRAST  Result Date: 11/20/2019 CLINICAL DATA:  Stroke, follow-up. EXAM: CT ANGIOGRAPHY HEAD AND NECK TECHNIQUE: Multidetector CT imaging of the head and neck was performed using the standard protocol during bolus administration of intravenous contrast. Multiplanar CT image reconstructions and MIPs were obtained to evaluate the vascular anatomy. Carotid stenosis measurements (when applicable) are obtained utilizing NASCET criteria, using the distal internal carotid diameter as the denominator. CONTRAST:  23mL OMNIPAQUE IOHEXOL 350 MG/ML SOLN COMPARISON:  Brain MRI 11/20/2018 FINDINGS: CT HEAD FINDINGS Brain: Redemonstrated small acute infarct within the left occipital lobe. Additional known acute infarcts within the left parietal lobe and within the left cerebral hemisphere at the watershed zone between the left ACA and MCA territories were better appreciated on MRI performed earlier the same day. No evidence of hemorrhagic conversion. No significant mass effect. No midline shift. No extra-axial fluid collection. Cerebral volume is normal for age. Vascular: Reported below. Skull: Normal. Negative for fracture or focal lesion. Sinuses: Small amount of frothy secretions  layering within the left maxillary sinus. No significant mastoid effusion. Orbits: No acute abnormality. Review of the MIP images confirms the above findings CTA NECK FINDINGS Aortic arch: Common origin of the innominate and left common carotid arteries. The visualized aortic arch is unremarkable. No innominate or proximal subclavian artery stenosis Right carotid system: CCA and ICA patent within the neck without stenosis. Minimal mixed plaque within the proximal ICA. Left carotid system: The CCA is patent to the bifurcation without significant stenosis (50% or greater) prominently soft plaque within the carotid bifurcation and extending into the proximal ICA. Resultant 50-60% stenosis of the proximal ICA. Distal to this, the ICA is patent within the neck without significant stenosis. Vertebral arteries: The left vertebral artery is dominant. The vertebral arteries are patent within the neck without significant stenosis Skeleton: No acute bony abnormality or aggressive osseous lesion. Other neck: No soft tissue neck mass. Cervical chain lymph nodes are prominent in number throughout the neck. Upper chest: No consolidation within the imaged lung apices. Review of the MIP images confirms the above findings CTA HEAD FINDINGS Anterior circulation: The intracranial internal carotid arteries are patent without significant stenosis. No M2 proximal branch occlusion or high-grade proximal arterial stenosis is identified. The anterior cerebral arteries are patent without high-grade proximal stenosis. No intracranial aneurysm is identified. Posterior circulation: The intracranial vertebral arteries are patent without significant stenosis, as is the basilar artery.  The bilateral posterior cerebral arteries are patent proximally without high-grade stenosis. Posterior communicating arteries are hypoplastic or absent bilaterally. Venous sinuses: Within limitations of contrast timing, no convincing thrombus. 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 No hemorrhagic conversion. No interval infarct is identified. No significant mass effect. CTA neck: 1. Predominantly soft plaque within the distal left CCA and proximal left ICA. Resultant 50-60% stenosis of the proximal left ICA. 2. The right common carotid artery, right internal carotid artery and bilateral vertebral arteries are patent within the neck without significant stenosis. 3. Cervical chain lymph nodes are diffusely prominent in number throughout the neck. Findings are nonspecific and clinical correlation is recommended. CTA head: No intracranial large vessel occlusion or proximal high-grade arterial stenosis. Electronically Signed   By: Kellie Simmering DO   On: 11/20/2019 11:31   MR BRAIN WO CONTRAST  Result Date: 11/20/2019 CLINICAL DATA:  Headache with vomiting. Right arm numbness. EXAM: MRI HEAD WITHOUT CONTRAST TECHNIQUE: Multiplanar, multiecho pulse sequences of the brain and surrounding structures were obtained without intravenous contrast. COMPARISON:  None. FINDINGS: Brain: Multiple small foci of abnormal diffusion restriction within the left hemisphere, including the left occipital lobe, left parietal periatrial white matter and at the watershed zone between the left anterior and middle cerebral arteries. No diffusion abnormality in the right hemisphere or cerebellum. Multifocal white matter hyperintensity, most commonly due to chronic ischemic microangiopathy. Normal volume of CSF spaces. No chronic microhemorrhage. Normal midline structures. Vascular: Normal flow voids. Skull and upper cervical spine: Normal marrow signal. Sinuses/Orbits: Negative. Other:  None. IMPRESSION: 1. 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 2. No hemorrhage or mass effect. Electronically Signed   By: Ulyses Jarred M.D.   On: 11/20/2019 02:19   ECHOCARDIOGRAM COMPLETE  Result Date: 11/20/2019    ECHOCARDIOGRAM REPORT   Patient Name:   Samantha Powers Date of Exam: 11/20/2019 Medical Rec #:  [id number]       Height:       64.0 in Accession #:    [id number]      Weight:       150.0 lb Date of Birth:  Feb 04, 1966       BSA:          1.731 m Patient Age:    25 years        BP:           168/99 mmHg Patient Gender: F               HR:           103 bpm. Exam Location:  [facility] Procedure: 2D Echo, Color Doppler and Cardiac Doppler Indications:     I163.9 Stroke  History:         Patient has no prior history of Echocardiogram examinations.                  Signs/Symptoms:Frequent headaches.  Sonographer:     Charmayne Sheer RDCS (AE) Referring Phys:  [id number] Athena Masse Diagnosing Phys: Yolonda Kida MD  Sonographer Comments: Suboptimal subcostal window. IMPRESSIONS  1. Left ventricular ejection fraction, by estimation, is 65 to 70%. The left ventricle has normal function. The left ventricle has no regional wall motion abnormalities. Left ventricular diastolic parameters are consistent with Grade I diastolic dysfunction (impaired relaxation).  2. Right ventricular systolic function is normal. The right ventricular size is normal.  3. The mitral valve is normal in structure. No evidence of mitral valve regurgitation.  4. The aortic valve is normal in structure. Aortic valve regurgitation is not visualized. FINDINGS  Left Ventricle: Left ventricular ejection fraction, by estimation, is 65 to 70%. The left ventricle has normal function. The left ventricle has no regional wall motion abnormalities. The left ventricular internal cavity size was normal in  size. There is  no left ventricular hypertrophy. Left ventricular diastolic parameters are consistent with Grade I diastolic dysfunction (impaired relaxation). Right Ventricle: The right ventricular size is normal. No increase in right ventricular wall thickness. Right ventricular systolic function is normal. Left Atrium: Left atrial size was normal in size. Right Atrium: Right atrial size was normal in size. Pericardium: There is no evidence of pericardial effusion. Mitral Valve: The mitral valve is normal in structure. No evidence of mitral valve regurgitation. MV peak gradient, 5.3 mmHg. The mean mitral valve gradient is 3.0 mmHg. Tricuspid Valve: The tricuspid valve is normal in structure. Tricuspid valve regurgitation is trivial. Aortic Valve: The aortic valve is normal in structure. Aortic valve regurgitation is not visualized. Aortic valve mean gradient measures 6.0 mmHg. Aortic valve peak gradient measures 11.0 mmHg. Aortic valve area, by VTI measures 1.61 cm. Pulmonic Valve: The pulmonic valve was normal in structure. Pulmonic valve regurgitation is not visualized. Aorta: The aortic root is normal in size and structure. IAS/Shunts: No atrial level shunt detected by color flow Doppler.  LEFT VENTRICLE PLAX 2D LVIDd:         4.04 cm  Diastology LVIDs:         2.43 cm  LV e' lateral:   9.36 cm/s LV PW:         0.89 cm  LV E/e' lateral: 10.6 LV IVS:        0.77 cm  LV e' medial:    8.81 cm/s LVOT diam:     1.90 cm  LV E/e' medial:  11.2 LV SV:         53 LV SV Index:   31 LVOT Area:     2.84 cm  RIGHT VENTRICLE RV Basal diam:  2.65 cm LEFT ATRIUM             Index       RIGHT ATRIUM          Index LA diam:        2.70 cm 1.56 cm/m  RA Area:     9.21 cm LA Vol (A2C):   21.7 ml 12.53 ml/m RA Volume:   16.20 ml 9.36 ml/m LA Vol (A4C):   33.6 ml 19.41 ml/m LA Biplane Vol: 27.7 ml 16.00 ml/m  AORTIC VALVE                    PULMONIC VALVE AV Area (Vmax):    1.90 cm     PV Vmax:       1.29 m/s AV Area  (Vmean):   1.79 cm     PV Vmean:      81.500 cm/s AV Area (VTI):     1.61 cm     PV VTI:  0.214 m AV Vmax:           166.00 cm/s  PV Peak grad:  6.7 mmHg AV Vmean:          112.000 cm/s PV Mean grad:  3.0 mmHg AV VTI:            0.329 m AV Peak Grad:      11.0 mmHg AV Mean Grad:      6.0 mmHg LVOT Vmax:         111.00 cm/s LVOT Vmean:        70.900 cm/s LVOT VTI:          0.187 m LVOT/AV VTI ratio: 0.57  AORTA Ao Root diam: 3.00 cm MITRAL VALVE MV Area (PHT): 6.54 cm     SHUNTS MV Peak grad:  5.3 mmHg     Systemic VTI:  0.19 m MV Mean grad:  3.0 mmHg     Systemic Diam: 1.90 cm MV Vmax:       1.15 m/s MV Vmean:      77.9 cm/s MV Decel Time: 116 msec MV E velocity: 99.00 cm/s MV A velocity: 125.00 cm/s MV E/A ratio:  0.79 Dwayne D Callwood MD Electronically signed by Alwyn Peawayne D Callwood MD Signature Date/Time: 11/20/2019/3:56:57 PM    Final     Medications:  I have reviewed the patient's current medications. Scheduled: .  stroke: mapping our early stages of recovery book   Does not apply Once  . aspirin EC  81 mg Oral Daily  . atorvastatin  40 mg Oral q1800  . clopidogrel  75 mg Oral Daily  . enoxaparin (LOVENOX) injection  40 mg Subcutaneous Q24H  . sodium chloride flush  3 mL Intravenous Once    Assessment/Plan: 54 y.o. female with no known PMH presenting with headache, nausea, vomiting and difficulty with speech.  Patient now at baseline.  MRI of the brain personally reviewed and shows multiple small infarcts in the left hemisphere.  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  Patient on no antiplatelet therapy prior to admission.  Has not seen a physician in quite some time.  CTA shows soft plaque in the distal left CCA and proximal left ICA at 50-60% stenosis.  Echocardiogram shows no cardiac source of emboli with an EF of 65-70%.  A1c 5.7, LDL 167.  Recommendations: 1. Dual antiplatelet therapy with ASA 81mg  and Plavix 75mg  for three weeks with change to ASA 81mg  daily alone as monotherapy after that  time. 2. Statin for lipid management with target LDL<70. 3. Patient to have TEE.  Patient has eaten today therefore will likely need to be performed on an outpatient basis.  If TEE unremarkable patient should have prolonged cardiac monitoring. 4. Hypercoag panel pending.  To be followed up with her outpatient neurology evaluation to be scheduled at discharge. 5. Ophthalmology evaluation on an outpatient basis.  Patient unable to drive until cleared by ophthalmology.  Case discussed with Dr. Fran LowesLai.  Thank you for allowing neurology to participate in the care of this patient.      LOS: 1 day   Samantha FarrLeslie Jazalynn Mireles, MD Neurology [phone] 11/21/2019  10:43 AM

## 2019-11-22 LAB — PROTEIN S ACTIVITY: Protein S Activity: 89 % (ref 63–140)

## 2019-11-22 LAB — LUPUS ANTICOAGULANT PANEL
DRVVT: 34.2 s (ref 0.0–47.0)
PTT Lupus Anticoagulant: 32.9 s (ref 0.0–51.9)

## 2019-11-22 LAB — BETA-2-GLYCOPROTEIN I ABS, IGG/M/A
Beta-2 Glyco I IgG: <9 GPI IgG units (ref 0–20)
Beta-2-Glycoprotein I IgA: <9 GPI IgA units (ref 0–25)
Beta-2-Glycoprotein I IgM: 10 GPI IgM units (ref 0–32)

## 2019-11-22 LAB — PROTEIN S, TOTAL: Protein S Ag, Total: 83 % (ref 60–150)

## 2019-11-22 LAB — PROTEIN C ACTIVITY: Protein C Activity: 155 % (ref 73–180)

## 2019-11-23 LAB — PROTEIN C, TOTAL: Protein C, Total: 103 % (ref 60–150)

## 2019-11-26 ENCOUNTER — Encounter: Payer: Self-pay | Admitting: Internal Medicine

## 2019-11-26 ENCOUNTER — Other Ambulatory Visit (INDEPENDENT_AMBULATORY_CARE_PROVIDER_SITE_OTHER): Payer: BC Managed Care – PPO

## 2019-11-26 ENCOUNTER — Ambulatory Visit (INDEPENDENT_AMBULATORY_CARE_PROVIDER_SITE_OTHER): Payer: BC Managed Care – PPO | Admitting: Internal Medicine

## 2019-11-26 ENCOUNTER — Other Ambulatory Visit: Payer: Self-pay

## 2019-11-26 VITALS — BP 140/98 | HR 86 | Ht 63.0 in | Wt 165.2 lb

## 2019-11-26 DIAGNOSIS — I639 Cerebral infarction, unspecified: Secondary | ICD-10-CM | POA: Diagnosis not present

## 2019-11-26 DIAGNOSIS — I10 Essential (primary) hypertension: Secondary | ICD-10-CM | POA: Diagnosis not present

## 2019-11-26 DIAGNOSIS — E785 Hyperlipidemia, unspecified: Secondary | ICD-10-CM

## 2019-11-26 LAB — FACTOR 5 LEIDEN

## 2019-11-26 LAB — PROTHROMBIN GENE MUTATION

## 2019-11-26 NOTE — Progress Notes (Signed)
New Outpatient Visit Date: 11/26/2019  Primary Care Provider: None  Chief Complaint: Stroke  HPI:  Ms. Inscoe is a 54 y.o. female who is being seen today for the evaluation of stroke. She has a history of migraine headaches.  She presented to the [facility] Emergency Department on 11/19/2019 with a 1 day history of headaches, vomiting, and transient numbness in both hands.  She thought her symptoms were related to migraine headaches, though brain MRI revealed multiple acute infarcts involving the left cerebral hemisphere.  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  Given multifocal infarct, TEE and cardiac monitoring were recommended by neurology.  Today, Ms. Manninen reports that she almost feels back to normal.  She still has some fatigue and vision abnormalities on the right.  She has not had any further neurologic changes since leaving the hospital.  However, she is very concerned about having another stroke.  She denies any chest pain, palpitations, edema, shortness of breath, and orthopnea.  She notes occasional brief dizziness but no syncope or falls.  She does not have a history of prior cardiac disease.  She notes that propranolol was added to her medication regimen earlier today by her neurologist.  --------------------------------------------------------------------------------------------------  Cardiovascular History & Procedures: Cardiovascular Problems:  Stroke  Risk Factors:  Stroke and hyperlipidemia  Cath/PCI:  None  CV Surgery:  None  EP Procedures and Devices:  None  Non-Invasive Evaluation(s):  TTE (11/20/2019): Normal LV size and wall thickness.  LVEF 65 to 70% with grade 1 diastolic dysfunction.  Normal RV size and function.  No significant valvular abnormality.  Recent CV Pertinent Labs: Lab Results  Component Value Date   CHOL 261 (H) 11/19/2019   HDL 46 11/19/2019   LDLCALC 167 (H) 11/19/2019     TRIG 240 (H) 11/19/2019   CHOLHDL 5.7 11/19/2019   INR 1.1 11/20/2019   K 3.7 11/21/2019   MG 2.3 11/21/2019   BUN 16 11/21/2019   CREATININE 0.58 11/21/2019    --------------------------------------------------------------------------------------------------  Past Medical History:  Diagnosis Date  . Stroke [facility])     Past Surgical History:  Procedure Laterality Date  . CESAREAN SECTION      Current Meds  Medication Sig  . aspirin EC 81 MG EC tablet Take 1 tablet (81 mg total) by mouth daily.  Marland Kitchen atorvastatin (LIPITOR) 40 MG tablet Take 1 tablet (40 mg total) by mouth daily at 6 PM.  . butalbital-acetaminophen-caffeine (FIORICET) 50-325-40 MG tablet Take 1-2 tablets by mouth every 12 (twelve) hours as needed for headache.  . clopidogrel (PLAVIX) 75 MG tablet Take 1 tablet (75 mg total) by mouth daily.  Marland Kitchen ibuprofen (ADVIL) 200 MG tablet Take 200 mg by mouth every 6 (six) hours as needed for headache.  . promethazine (PHENERGAN) 25 MG tablet Take by mouth every 6 (six) hours as needed.  . propranolol ER (INDERAL LA) 60 MG 24 hr capsule Take by mouth at bedtime.    Allergies: Penicillins and Shellfish allergy  Social History   Tobacco Use  . Smoking status: Former Smoker    Packs/day: 0.50    Years: 20.00    Pack years: 10.00    Quit date: 2008    Years since quitting: 13.2  . Smokeless tobacco: Never Used  Substance Use Topics  . Alcohol use: Not Currently  . Drug use: Never    Family History  Problem Relation Age of Onset  .  Hypertension Mother   . COPD Mother   . Lymphoma Father   . Stroke Maternal Grandmother   . Coronary artery disease Maternal Grandfather        CABG    Review of Systems: A 12-system review of systems was performed and was negative except as noted in the HPI.  --------------------------------------------------------------------------------------------------  Physical Exam: BP (!) 140/98 (BP Location: Right Arm, Patient Position:  Sitting, Cuff Size: Normal)   Pulse 86   Ht 5\' 3"  (1.6 m)   Wt 165 lb 4 oz (75 kg)   SpO2 99%   BMI 29.27 kg/m   General: NAD.  Accompanied by her husband. HEENT: No conjunctival pallor or scleral icterus. Facemask in place. Neck: Supple without lymphadenopathy, thyromegaly, JVD, or HJR. No carotid bruit. Lungs: Normal work of breathing. Clear to auscultation bilaterally without wheezes or crackles. Heart: Regular rate and rhythm without murmurs, rubs, or gallops. Non-displaced PMI. Abd: Bowel sounds present. Soft, NT/ND without hepatosplenomegaly Ext: No lower extremity edema. Radial, PT, and DP pulses are 2+ bilaterally Skin: Warm and dry without rash. Neuro: CNIII-XII intact. Strength and fine-touch sensation intact in upper and lower extremities bilaterally. Psych: Normal mood and affect.  EKG: Normal sinus rhythm without abnormality.  Lab Results  Component Value Date   WBC 10.6 (H) 11/21/2019   HGB 14.6 11/21/2019   HCT 43.3 11/21/2019   MCV 86.3 11/21/2019   PLT 366 11/21/2019    Lab Results  Component Value Date   NA 138 11/21/2019   K 3.7 11/21/2019   CL 105 11/21/2019   CO2 24 11/21/2019   BUN 16 11/21/2019   CREATININE 0.58 11/21/2019   GLUCOSE 103 (H) 11/21/2019   ALT 39 11/19/2019    Lab Results  Component Value Date   CHOL 261 (H) 11/19/2019   HDL 46 11/19/2019   LDLCALC 167 (H) 11/19/2019   TRIG 240 (H) 11/19/2019   CHOLHDL 5.7 11/19/2019     --------------------------------------------------------------------------------------------------  ASSESSMENT AND PLAN: Stroke: Ms. Sica has recovered fairly well from her recent stroke though she still has some residual weakness and visual changes.  Given MRI finding of multiple small infarcts involving multiple left cerebral hemispheric lobes, cardioembolic source is a concern.  However, plaque was also noted in the left internal carotid artery, albeit not severe.  We have discussed the typical work-up  for cryptogenic stroke, including ambulatory cardiac monitoring and transesophageal echocardiography.  Ms. Ober is reluctant to pursue implantable loop recorder placement at this time and prefers external monitoring.  As such, we have agreed to begin with a 14-day event monitor.  We will also move forward with transesophageal echocardiogram next month.  I have discussed the risks and benefits of the procedure, and Ms. Galasso agrees to proceed.  If TEE and event monitor are unrevealing, we will discuss loop recorder placement again and refer her to EP if she is willing to move forward with this.  In the meantime, I recommend continuation of aspirin, clopidogrel, and statin therapy.  Hypertension: Blood pressure mildly elevated today.  Given addition of propranolol today by neurology, I will defer medication changes at this time.  Hyperlipidemia: Continue atorvastatin 40 mg daily for target LDL less than 70.  Follow-up: Return to clinic in 6 weeks.  Nelva Bush, MD 11/27/2019 10:29 AM

## 2019-11-26 NOTE — Patient Instructions (Signed)
Medication Instructions:  Your physician recommends that you continue on your current medications as directed. Please refer to the Current Medication list given to you today.  *If you need a refill on your cardiac medications before your next appointment, please call your pharmacy*   Lab Work: 1- COVID PRE- TEST: You will need a COVID TEST prior to the procedure:  LOCATION: [facility] Testing site.  DATE/TIME:  Friday, December 05, 2019 anytime between 8 am and 3 pm.  If you have labs (blood work) drawn today and your tests are completely normal, you will receive your results only by: Marland Kitchen MyChart Message (if you have MyChart) OR . A paper copy in the mail If you have any lab test that is abnormal or we need to change your treatment, we will call you to review the results.   Testing/Procedures: 1-  Your physician has recommended that you wear a 14 DAY Zio monitor. This monitor is a medical device that records the heart's electrical activity. Doctors most often use these monitors to diagnose arrhythmias. Arrhythmias are problems with the speed or rhythm of the heartbeat. The monitor is a small device applied to your chest. You can wear one while you do your normal daily activities. While wearing this monitor if you have any symptoms to push the button and record what you felt. Once you have worn this monitor for the period of time provider prescribed (Usually 14 days), you will return the monitor device in the postage paid box. Once it is returned they will download the data collected and provide [place] with a report which the provider will then review and we will call you with those results. Important tips:  1. Avoid showering during the first 24 hours of wearing the monitor. 2. Avoid excessive sweating to help maximize wear time. 3. Do not submerge the device, no hot tubs, and no swimming pools. 4. Keep any lotions or oils away from the patch. 5. After 24 hours you may shower  with the patch on. Take brief showers with your back facing the shower head.  6. Do not remove patch once it has been placed because that will interrupt data and decrease adhesive wear time. 7. Push the button when you have any symptoms and write down what you were feeling. 8. Once you have completed wearing your monitor, remove and place into box which has postage paid and place in your outgoing mailbox.  9. If for some reason you have misplaced your box then call our office and we can provide another box and/or mail it off for you.   2- Your physician has requested that you have a TEE. During a TEE, sound waves are used to create images of your heart. It provides your doctor with information about the size and shape of your heart and how well your heart's chambers and valves are working. In this test, a transducer is attached to the end of a flexible tube that's guided down your throat and into your esophagus (the tube leading from you mouth to your stomach) to get a more detailed image of your heart. You are not awake for the procedure. Please see the instruction sheet given to you today. For further information please visit [email or web address]  You are scheduled for a TEE __4/6/21____ with Dr.___END____ Please [facility] at the [facility] at __6:30___ a.m. on the day of your procedure.  DIET INSTRUCTIONS:  Nothing to eat or drink after midnight except your medications  with a small sip of water.        1) Labs: ___none_______  2) Medications:  YOU MAY TAKE ALL of your remaining medications with a small amount of water.  3) Must have a responsible person to drive you home.  4) Bring a current list of your medications and current insurance cards.    If you have any questions after you get home, please call the office at [phone]  Follow-Up: At [facility], you and your health needs are our priority.  As part of our continuing mission to provide you with exceptional heart care,  we have created designated Provider Care Teams.  These Care Teams include your primary Cardiologist (physician) and Advanced Practice Providers (APPs -  Physician Assistants and Nurse Practitioners) who all work together to provide you with the care you need, when you need it.  We recommend signing up for the patient portal called "MyChart".  Sign up information is provided on this After Visit Summary.  MyChart is used to connect with patients for Virtual Visits (Telemedicine).  Patients are able to view lab/test results, encounter notes, upcoming appointments, etc.  Non-urgent messages can be sent to your provider as well.   To learn more about what you can do with MyChart, go to [email or web address].    Your next appointment:   6 week(s)  The format for your next appointment:   In Person  Provider:    You may see DR Cristal Deer END or one of the following Advanced Practice Providers on your designated Care Team:    Nicolasa Ducking, NP  Eula Listen, PA-C  Marisue Ivan, PA-C   Transesophageal Echocardiogram Transesophageal echocardiogram (TEE) is a test that uses sound waves to take pictures of your heart. TEE is done by passing a flexible tube down the esophagus. The esophagus is the tube that carries food from the throat to the stomach. The pictures give detailed images of your heart. This can help your doctor see if there are problems with your heart. What happens before the procedure? Staying hydrated Follow instructions from your doctor about hydration, which may include: ? Drink plenty of water the day before.   Eating and drinking  DO NOT eat or drink anything after midnight other than a small sip of water with your medications.   General instructions  You will need to take out any dentures or retainers.  Plan to have someone take you home from the hospital or clinic.  If you will be going home right after the procedure, plan to have someone with you for 24 hours.   Ask your doctor about: ? Changing or stopping your normal medicines. This is important if you take diabetes medicines or blood thinners. ? Taking over-the-counter medicines, vitamins, herbs, and supplements. ? Taking medicines such as aspirin and ibuprofen. These medicines can thin your blood. Do not take these medicines unless your doctor tells you to take them. What happens during the procedure?  To lower your risk of infection, your doctors will wash or clean their hands.  An IV will be put into one of your veins.  You will be given a medicine to help you relax (sedative).  A medicine may be sprayed or gargled. This numbs the back of your throat.  Your blood pressure, heart rate, and breathing will be watched.  You may be asked to lay on your left side.  A bite block will be placed in your mouth. This keeps you from biting the tube.  The tip of the TEE probe will be placed into the back of your mouth.  You will be asked to swallow.  Your doctor will take pictures of your heart.  The probe and bite block will be taken out. The procedure may vary among doctors and hospitals. What happens after the procedure?   Your blood pressure, heart rate, breathing rate, and blood oxygen level will be watched until the medicines you were given have worn off.  When you first wake up, your throat may feel sore and numb. This will get better over time. You will not be allowed to eat or drink until the numbness has gone away.  Do not drive for 24 hours if you were given a medicine to help you relax. Summary  TEE is a test that uses sound waves to take pictures of your heart.  You will be given a medicine to help you relax.  Do not drive for 24 hours if you were given a medicine to help you relax. This information is not intended to replace advice given to you by your health care provider. Make sure you discuss any questions you have with your health care provider. Document Revised:  05/10/2018 Document Reviewed: 11/22/2016 Elsevier Patient Education  [place].

## 2019-11-27 ENCOUNTER — Encounter: Payer: Self-pay | Admitting: Internal Medicine

## 2019-11-27 DIAGNOSIS — E785 Hyperlipidemia, unspecified: Secondary | ICD-10-CM | POA: Insufficient documentation

## 2019-11-27 DIAGNOSIS — I10 Essential (primary) hypertension: Secondary | ICD-10-CM | POA: Insufficient documentation

## 2019-11-28 ENCOUNTER — Ambulatory Visit: Payer: BLUE CROSS/BLUE SHIELD | Admitting: Physician Assistant

## 2019-12-05 ENCOUNTER — Inpatient Hospital Stay: Admission: RE | Admit: 2019-12-05 | Payer: BC Managed Care – PPO | Source: Ambulatory Visit

## 2019-12-08 ENCOUNTER — Telehealth: Payer: Self-pay | Admitting: Internal Medicine

## 2019-12-08 DIAGNOSIS — Z01810 Encounter for preprocedural cardiovascular examination: Secondary | ICD-10-CM

## 2019-12-08 DIAGNOSIS — I10 Essential (primary) hypertension: Secondary | ICD-10-CM

## 2019-12-08 DIAGNOSIS — I639 Cerebral infarction, unspecified: Secondary | ICD-10-CM

## 2019-12-08 NOTE — Telephone Encounter (Signed)
I was contacted by [facility] that the patient has not had COVID-19 testing performed.  Please determine if she can be tested today and proceed with TEE tomorrow or if test needs to be rescheduled.  As to the patient's concern regarding pre-procedure COVID-19 testing, the AVS from office visit on 11/26/2019 states that the patient was to have COVID-19 testing done between 8 and 3 on 12/05/2019.  I am not sure if the Good Friday holiday may have affected this availability, but it appears that location and time were communicated to the patient.  Additionally, I spoke with the patient and her husband during our office visit that event monitor and TEE are diagnostic only.  Neither procedure is therapeutic.  Information obtained from the studies will guide additional testing and treatment to prevent more strokes in the future.  Yvonne Kendall, MD [facility]

## 2019-12-08 NOTE — Telephone Encounter (Signed)
Patients husband calling in stating they were not called and told when to go for covid test for upcoming procedure (TEE on 4/6). Patient will need to be rescheduled and given a new covid test date  Husband also has a question; If there is a finding in the test, is this procedure to "find and fix" or just to find? Please advise

## 2019-12-08 NOTE — Telephone Encounter (Signed)
Request that when call back is made, call wife number.   Made call back to husband to figure out the events leading up to today.   He reports that he did not know when COVID test was due and is requesting to reschedule. Per AVS pt was due to have covid testing last Friday.   I attempted to see if they could do a 24 hour covid test but husband requested that TEE be delayed at this time.   There was also a question of what all is involved with TEE. They are wanting to know if there will be any procedure done while imaging is performed or imaging alone. I told him my impression is imaging alone but would reach out to Dr. Laurelyn Sickle RN for clarification.   Routing to [employer] to clarify and possibly reschedule TEE and cv testing.

## 2019-12-09 ENCOUNTER — Encounter: Admission: RE | Payer: Self-pay | Source: Home / Self Care

## 2019-12-09 ENCOUNTER — Ambulatory Visit
Admission: RE | Admit: 2019-12-09 | Payer: BC Managed Care – PPO | Source: Home / Self Care | Admitting: Internal Medicine

## 2019-12-09 SURGERY — ECHOCARDIOGRAM, TRANSESOPHAGEAL
Anesthesia: Moderate Sedation

## 2019-12-11 NOTE — Telephone Encounter (Signed)
No answer. Left message to call back on patient's and husbands numbers.

## 2019-12-17 NOTE — Telephone Encounter (Signed)
Patient returning call.

## 2019-12-17 NOTE — Telephone Encounter (Signed)
No answer. Left message to call back.   

## 2019-12-18 MED ORDER — CLOPIDOGREL BISULFATE 75 MG PO TABS: 75.0000 mg | ORAL_TABLET | Freq: Every day | ORAL | 5 refills | Status: AC

## 2019-12-18 NOTE — Addendum Note (Signed)
Addended by: Jeannetta Nap on: 12/18/2019 03:20 PM   Modules accepted: Orders

## 2019-12-18 NOTE — Telephone Encounter (Signed)
No answer. Left message to call back with patient's number.

## 2019-12-18 NOTE — Telephone Encounter (Signed)
Spoke with patient. Her main reason for not having the TEE is "having the tube put down her throat." I explained the procedure and that she would have light/conscious sedation and would most likely not remember anything from the procedure. I assured her she would have Dr End and nursing staff there monitoring her during the procedure. She did not have any further questions at this time. I let her know to please call us back at anytime and we would be happy to explain anything and have Dr End call her if needed.  She was very kind and appreciative.   She agreed to proceeding with rescheduling and does want to have this done. She agreed to May 4th at 7:30 am with arrival time of 6:30a. She is aware to get her COVID test on 01/02/20 between the times of 8 am-1 pm [facility] and to get lab work in [employer] that day as well. She still has her AVS from last office visit with other instructions regarding the procedure and verbalized understanding.  She needed a refill of the Plavix as she would run out on Sunday. Refill sent to pharmacy.  Routing to Dr End to make him aware.

## 2019-12-31 ENCOUNTER — Telehealth: Payer: Self-pay | Admitting: *Deleted

## 2019-12-31 NOTE — Telephone Encounter (Signed)
-----   Message from Yvonne Kendall, MD sent at 12/30/2019  9:04 PM EDT ----- Please let Ms. Melichar know that her monitor did not show any significant arrhythmia to explain her recent stroke.  I recommend that we proceed with TEE, as planned.

## 2019-12-31 NOTE — Telephone Encounter (Addendum)
No answer. Left message to call back.   

## 2020-01-01 NOTE — Telephone Encounter (Signed)
Results called to pt. Pt verbalized understanding.  

## 2020-01-01 NOTE — Telephone Encounter (Signed)
No answer. Left message to call back.   

## 2020-01-02 ENCOUNTER — Other Ambulatory Visit: Payer: Self-pay

## 2020-01-02 ENCOUNTER — Other Ambulatory Visit
Admission: RE | Admit: 2020-01-02 | Discharge: 2020-01-02 | Disposition: A | Discharge status: Home / Self Care | Payer: BC Managed Care – PPO | Source: Ambulatory Visit | Attending: Internal Medicine | Admitting: Internal Medicine

## 2020-01-02 ENCOUNTER — Other Ambulatory Visit
Admission: RE | Admit: 2020-01-02 | Discharge: 2020-01-02 | Disposition: A | Discharge status: Home / Self Care | Payer: BC Managed Care – PPO | Source: Home / Self Care | Attending: Internal Medicine | Admitting: Internal Medicine

## 2020-01-02 DIAGNOSIS — I639 Cerebral infarction, unspecified: Secondary | ICD-10-CM | POA: Insufficient documentation

## 2020-01-02 DIAGNOSIS — Z20822 Contact with and (suspected) exposure to covid-19: Secondary | ICD-10-CM | POA: Diagnosis not present

## 2020-01-02 DIAGNOSIS — I10 Essential (primary) hypertension: Secondary | ICD-10-CM | POA: Insufficient documentation

## 2020-01-02 DIAGNOSIS — Z01812 Encounter for preprocedural laboratory examination: Secondary | ICD-10-CM | POA: Diagnosis present

## 2020-01-02 DIAGNOSIS — Z01810 Encounter for preprocedural cardiovascular examination: Secondary | ICD-10-CM

## 2020-01-02 LAB — CBC WITH DIFFERENTIAL/PLATELET
Abs Immature Granulocytes: 0.01 10*3/uL (ref 0.00–0.07)
Basophils Absolute: 0 10*3/uL (ref 0.0–0.1)
Basophils Relative: 1 %
Eosinophils Absolute: 0.1 10*3/uL (ref 0.0–0.5)
Eosinophils Relative: 2 %
HCT: 43.1 % (ref 36.0–46.0)
Hemoglobin: 14.7 g/dL (ref 12.0–15.0)
Immature Granulocytes: 0 %
Lymphocytes Relative: 38 %
Lymphs Abs: 2.8 10*3/uL (ref 0.7–4.0)
MCH: 29.3 pg (ref 26.0–34.0)
MCHC: 34.1 g/dL (ref 30.0–36.0)
MCV: 85.9 fL (ref 80.0–100.0)
Monocytes Absolute: 0.7 10*3/uL (ref 0.1–1.0)
Monocytes Relative: 9 %
Neutro Abs: 3.7 10*3/uL (ref 1.7–7.7)
Neutrophils Relative %: 50 %
Platelets: 279 10*3/uL (ref 150–400)
RBC: 5.02 MIL/uL (ref 3.87–5.11)
RDW: 12.8 % (ref 11.5–15.5)
WBC: 7.3 10*3/uL (ref 4.0–10.5)
nRBC: 0 % (ref 0.0–0.2)

## 2020-01-02 LAB — BASIC METABOLIC PANEL
Anion gap: 10 (ref 5–15)
BUN: 8 mg/dL (ref 6–20)
CO2: 25 mmol/L (ref 22–32)
Calcium: 9.2 mg/dL (ref 8.9–10.3)
Chloride: 106 mmol/L (ref 98–111)
Creatinine, Ser: 0.53 mg/dL (ref 0.44–1.00)
GFR calc Af Amer: >60 mL/min (ref >60)
GFR calc non Af Amer: >60 mL/min (ref >60)
Glucose, Bld: 97 mg/dL (ref 70–99)
Potassium: 4 mmol/L (ref 3.5–5.1)
Sodium: 141 mmol/L (ref 135–145)

## 2020-01-02 LAB — SARS CORONAVIRUS 2 (TAT 6-24 HRS): SARS Coronavirus 2: NEGATIVE

## 2020-01-05 ENCOUNTER — Telehealth: Payer: Self-pay | Admitting: Internal Medicine

## 2020-01-05 NOTE — Telephone Encounter (Signed)
I attempted to call Samantha Powers back. No answer- I left a message on his identified voice mail that I have cancelled the patient's TEE for tomorrow, but Dr. Okey Dupre would like her to keep her follow up appointment on Thursday with him.  I asked that he call back with any other questions/ concerns.

## 2020-01-05 NOTE — Telephone Encounter (Signed)
I attempted to call the patient's husband, Duane. No answer- I left a message on his identified voice mail to please call me back as I was unsure if they had questions about the procedure tomorrow or if they want to cancel the TEE for sure.

## 2020-01-05 NOTE — Telephone Encounter (Signed)
I spoke with the patient's husband, Duane. He states that the patient definitely wants to cancel the TEE that is scheduled for tomorrow with Dr. Okey Dupre.  Per the patient's husband, she is very nervous about the test, but more than that, he wanted to let us know that he thinks she is just very embarrassed about dental work she needs.  Per Duane, the patient has not come out and told him this, but he knows this is a big issue for her. He would prefer that the patient not know he let us know this information, but feels this is the bigger reason for not wanting the TEE along with her nerves.   He is wanting to know if this would be the end of the line for testing. I advised that in reviewing the patient's chart, Dr. Okey Dupre had also mentioned an ILR.  Duane voiced understanding that he knew the TEE was looking for a "hole" in her heart.  I advised this is correct. We also discussed the role of the ILR.  I have advised Duane I will cancel the patient's TEE for tomorrow. He is wanting to know should she keep her follow up appointment on Thursday with Dr. Okey Dupre. I advised I would forward a message to Dr. Okey Dupre to clarify and we will call him back.  Duane was very Adult nurse.

## 2020-01-05 NOTE — Telephone Encounter (Signed)
Attempted to call the patient on her cell # to obtain a verbal ok to speak with her husband.  No DPR on file giving up permission to speak with him.  No answer- I left a message to please call back.

## 2020-01-05 NOTE — Telephone Encounter (Signed)
Patient is calling back giving permission to discuss her information with her husband, Duane.

## 2020-01-05 NOTE — Telephone Encounter (Signed)
I agree with keeping f/u appointment on Thursday to discuss her concerns further.  Yvonne Kendall, MD [facility]

## 2020-01-05 NOTE — Telephone Encounter (Signed)
Patients husband calling in to discuss procedure scheduled for tomorrow and possibly cancel. Please advise when able. If husband does not answer, please leave a voicemail.

## 2020-01-06 ENCOUNTER — Ambulatory Visit
Admission: RE | Admit: 2020-01-06 | Payer: BC Managed Care – PPO | Source: Home / Self Care | Admitting: Internal Medicine

## 2020-01-06 ENCOUNTER — Encounter: Admission: RE | Payer: Self-pay | Source: Home / Self Care

## 2020-01-06 SURGERY — ECHOCARDIOGRAM, TRANSESOPHAGEAL
Anesthesia: Moderate Sedation

## 2020-01-08 ENCOUNTER — Other Ambulatory Visit: Payer: Self-pay

## 2020-01-08 ENCOUNTER — Encounter: Payer: Self-pay | Admitting: Internal Medicine

## 2020-01-08 ENCOUNTER — Ambulatory Visit (INDEPENDENT_AMBULATORY_CARE_PROVIDER_SITE_OTHER): Payer: BC Managed Care – PPO | Admitting: Internal Medicine

## 2020-01-08 VITALS — BP 124/84 | HR 76 | Ht 63.0 in | Wt 159.0 lb

## 2020-01-08 DIAGNOSIS — I639 Cerebral infarction, unspecified: Secondary | ICD-10-CM | POA: Diagnosis not present

## 2020-01-08 DIAGNOSIS — I10 Essential (primary) hypertension: Secondary | ICD-10-CM

## 2020-01-08 NOTE — Patient Instructions (Signed)
Medication Instructions:  Your physician recommends that you continue on your current medications as directed. Please refer to the Current Medication list given to you today.  *If you need a refill on your cardiac medications before your next appointment, please call your pharmacy*   Lab Work: COVID PRE- TEST: You will need a COVID TEST prior to the procedure:  LOCATION: [address] Testing site.  DATE/TIME:  ___________________________________   Your physician recommends that you return for lab work in: pending TEE for CBC, BMET.  - Please go to the [facility]. You will check in at the front desk to the right as you walk into the atrium. Valet Parking is offered if needed. - No appointment needed. You may go any day between 7 am and 6 pm.   If you have labs (blood work) drawn today and your tests are completely normal, you will receive your results only by: Marland Kitchen MyChart Message (if you have MyChart) OR . A paper copy in the mail If you have any lab test that is abnormal or we need to change your treatment, we will call you to review the results.   Testing/Procedures:  Your physician has requested that you have a TEE. During a TEE, sound waves are used to create images of your heart. It provides your doctor with information about the size and shape of your heart and how well your heart's chambers and valves are working. In this test, a transducer is attached to the end of a flexible tube that's guided down your throat and into your esophagus (the tube leading from you mouth to your stomach) to get a more detailed image of your heart. You are not awake for the procedure. Please see the instruction sheet given to you today. For further information please visit [email or web address].  You are scheduled for a TEE __________ with Dr.___END____ Please arrive at the [place] [facility] at _________ a.m. on the day of your procedure.  DIET INSTRUCTIONS:  Nothing to  eat or drink after midnight except your medications with a small sip of water.        1. Labs: __pending date of TEE______  2. Medications:  YOU MAY TAKE ALL of your remaining medications with a small amount of water.  3. Must have a responsible person to drive you home.  4. Bring a current list of your medications and current insurance cards.        If you have any questions after you get home, please call the office at [phone]    Follow-Up: At [facility], you and your health needs are our priority.  As part of our continuing mission to provide you with exceptional heart care, we have created designated Provider Care Teams.  These Care Teams include your primary Cardiologist (physician) and Advanced Practice Providers (APPs -  Physician Assistants and Nurse Practitioners) who all work together to provide you with the care you need, when you need it.  We recommend signing up for the patient portal called "MyChart".  Sign up information is provided on this After Visit Summary.  MyChart is used to connect with patients for Virtual Visits (Telemedicine).  Patients are able to view lab/test results, encounter notes, upcoming appointments, etc.  Non-urgent messages can be sent to your provider as well.   To learn more about what you can do with MyChart, go to [email or web address].    Your next appointment:   Pending TEE.  The format for your  next appointment:   In Person  Provider:    You may see DR Cristal Deer END or one of the following Advanced Practice Providers on your designated Care Team:    Nicolasa Ducking, NP  Eula Listen, PA-C  Marisue Ivan, PA-C   Transesophageal Echocardiogram Transesophageal echocardiogram (TEE) is a test that uses sound waves to take pictures of your heart. TEE is done by passing a flexible tube down the esophagus. The esophagus is the tube that carries food from the throat to the stomach. The pictures give detailed images of your  heart. This can help your doctor see if there are problems with your heart. What happens before the procedure? Staying hydrated Follow instructions from your doctor about hydration, which may include:  Up to 3 hours before the procedure - you may continue to drink clear liquids, such as: ? Water. ? Clear fruit juice. ? Black coffee. ? Plain tea.  Eating and drinking Follow instructions from your doctor about eating and drinking, which may include:  8 hours before the procedure - stop eating heavy meals or foods such as meat, fried foods, or fatty foods.  6 hours before the procedure - stop eating light meals or foods, such as toast or cereal.  6 hours before the procedure - stop drinking milk or drinks that contain milk.  3 hours before the procedure - stop drinking clear liquids. General instructions  You will need to take out any dentures or retainers.  Plan to have someone take you home from the hospital or clinic.  If you will be going home right after the procedure, plan to have someone with you for 24 hours.  Ask your doctor about: ? Changing or stopping your normal medicines. This is important if you take diabetes medicines or blood thinners. ? Taking over-the-counter medicines, vitamins, herbs, and supplements. ? Taking medicines such as aspirin and ibuprofen. These medicines can thin your blood. Do not take these medicines unless your doctor tells you to take them. What happens during the procedure?  To lower your risk of infection, your doctors will wash or clean their hands.  An IV will be put into one of your veins.  You will be given a medicine to help you relax (sedative).  A medicine may be sprayed or gargled. This numbs the back of your throat.  Your blood pressure, heart rate, and breathing will be watched.  You may be asked to lay on your left side.  A bite block will be placed in your mouth. This keeps you from biting the tube.  The tip of the TEE  probe will be placed into the back of your mouth.  You will be asked to swallow.  Your doctor will take pictures of your heart.  The probe and bite block will be taken out. The procedure may vary among doctors and hospitals. What happens after the procedure?   Your blood pressure, heart rate, breathing rate, and blood oxygen level will be watched until the medicines you were given have worn off.  When you first wake up, your throat may feel sore and numb. This will get better over time. You will not be allowed to eat or drink until the numbness has gone away.  Do not drive for 24 hours if you were given a medicine to help you relax. Summary  TEE is a test that uses sound waves to take pictures of your heart.  You will be given a medicine to help you relax.  Do not drive for 24 hours if you were given a medicine to help you relax. This information is not intended to replace advice given to you by your health care provider. Make sure you discuss any questions you have with your health care provider. Document Revised: 05/10/2018 Document Reviewed: 11/22/2016 Elsevier Patient Education  2020 [employer].

## 2020-01-08 NOTE — Progress Notes (Signed)
Follow-up Outpatient Visit Date: 01/08/2020  Primary Care Provider: Patient, No Pcp Per No address on file  Chief Complaint: Follow-up stroke  HPI:  [name] is a 54 y.o. female with history of stroke and migraine headaches, who presents for follow-up of cryptogenic stroke.  I met her in late March after preceding hospitalization at [facility] in mid March with acute stroke.  She was almost back to her baseline in regard to her neurologic function.  I recommended TEE and ambulatory cardiac monitoring.  14-day event monitor showed rare PACs and PVCs but no significant arrhythmia.  Unfortunately, TEE was scheduled twice but was canceled on both occasions due to patient related reasons.  Today, [name] reports feeling well.  She is almost back to baseline with only slight residual right arm weakness noted.  She has not had any new focal neurologic deficits.  She denies chest pain, shortness of breath, palpitations, lightheadedness, and edema.  She has been compliant with the DASH diet notes that her blood pressure has improved.  She also recently underwent a sleep study and is awaiting the results.  --------------------------------------------------------------------------------------------------  Cardiovascular History & Procedures: Cardiovascular Problems:  Stroke  Risk Factors:  Stroke and hyperlipidemia  Cath/PCI:  None  CV Surgery:  None  EP Procedures and Devices:  14-day event monitor (11/26/2019): Predominantly sinus rhythm with rare PACs and PVCs.  No significant arrhythmia.  Non-Invasive Evaluation(s):  TTE (11/20/2019): Normal LV size and wall thickness.  LVEF 65 to 70% with grade 1 diastolic dysfunction.  Normal RV size and function.  No significant valvular abnormality.  Recent CV Pertinent Labs: Lab Results  Component Value Date   CHOL 261 (H) 11/19/2019   HDL 46 11/19/2019   LDLCALC 167 (H) 11/19/2019   TRIG 240 (H) 11/19/2019   CHOLHDL 5.7 11/19/2019   INR 1.1 11/20/2019   K 4.0 01/02/2020   MG 2.3 11/21/2019   BUN 8 01/02/2020   CREATININE 0.53 01/02/2020    Past medical and surgical history were reviewed and updated in EPIC.  Current Meds  Medication Sig  . acetaminophen (TYLENOL) 500 MG tablet Take 1,000 mg by mouth every 6 (six) hours as needed (pain.).  Marland Kitchen aspirin EC 81 MG EC tablet Take 1 tablet (81 mg total) by mouth daily.  Marland Kitchen atorvastatin (LIPITOR) 40 MG tablet Take 1 tablet (40 mg total) by mouth daily at 6 PM.  . butalbital-acetaminophen-caffeine (FIORICET) 50-325-40 MG tablet Take 1-2 tablets by mouth every 12 (twelve) hours as needed for headache.  . clopidogrel (PLAVIX) 75 MG tablet Take 1 tablet (75 mg total) by mouth daily.  . promethazine (PHENERGAN) 25 MG tablet Take 25 mg by mouth every 6 (six) hours as needed for nausea or vomiting.  . propranolol ER (INDERAL LA) 60 MG 24 hr capsule Take 60 mg by mouth at bedtime.   . Vitamin D, Ergocalciferol, (DRISDOL) 1.25 MG (50000 UNIT) CAPS capsule Take 50,000 Units by mouth every Thursday.     Allergies: Penicillins and Shellfish allergy  Social History   Tobacco Use  . Smoking status: Former Smoker    Packs/day: 0.50    Years: 20.00    Pack years: 10.00    Quit date: 2008    Years since quitting: 13.3  . Smokeless tobacco: Never Used  Substance Use Topics  . Alcohol use: Not Currently  . Drug use: Never    Family History  Problem Relation Age of Onset  . Hypertension Mother   . COPD Mother   .  Lymphoma Father   . Stroke Maternal Grandmother   . Coronary artery disease Maternal Grandfather        CABG    Review of Systems: A 12-system review of systems was performed and was negative except as noted in the HPI.  --------------------------------------------------------------------------------------------------  Physical Exam: BP 124/84 (BP Location: Left Arm, Patient Position: Sitting, Cuff Size: Normal)   Pulse 76   Ht 5' 3" (1.6 m)   Wt 159 lb (72.1  kg)   SpO2 99%   BMI 28.17 kg/m   General: NAD. Neck: No JVD or HJR. Lungs: Clear to auscultation bilaterally without wheezes or crackles. Heart: Regular rate and rhythm without murmurs, rubs, or gallops. Abdomen: Soft, nontender, nondistended. Extremities: No lower extremity edema.  EKG: Normal sinus rhythm with poor R wave progression in V2 to V3, most likely due to lead placement.  Otherwise, no significant abnormality or change from prior tracing on 11/26/2019  Lab Results  Component Value Date   WBC 7.3 01/02/2020   HGB 14.7 01/02/2020   HCT 43.1 01/02/2020   MCV 85.9 01/02/2020   PLT 279 01/02/2020    Lab Results  Component Value Date   NA 141 01/02/2020   K 4.0 01/02/2020   CL 106 01/02/2020   CO2 25 01/02/2020   BUN 8 01/02/2020   CREATININE 0.53 01/02/2020   GLUCOSE 97 01/02/2020   ALT 39 11/19/2019    Lab Results  Component Value Date   CHOL 261 (H) 11/19/2019   HDL 46 11/19/2019   LDLCALC 167 (H) 11/19/2019   TRIG 240 (H) 11/19/2019   CHOLHDL 5.7 11/19/2019    --------------------------------------------------------------------------------------------------  ASSESSMENT AND PLAN: Cryptogenic stroke: [name] has almost completely recovered from her multifocal CVA in March, thought to be embolic in nature.  Work-up at that time was notable for moderate left carotid stenosis.  14-day event monitor was unrevealing.  We have planned twice to proceed with TEE, with the procedure having been canceled on both occasions.  [name] states that she is now mentally ready to proceed with TEE and will contact us to schedule this at her convenience.  I also advised her that ILR placement is indicated and could be done at the time of the TEE if no obvious cause for her stroke is identified by TEE or on a separate occasion.  [name] will contact us to let us know about her desire to proceed with ILR insertion.  In the meantime, [name] should continue her current  medications for secondary prevention including dual antiplatelet therapy and atorvastatin.  [name] should continue follow-up with neurology, as previously arranged.  Hypertension: Blood pressure well controlled today with lifestyle modifications and propranolol.  No further medication changes at this time.  Follow-up: Return to clinic after TEE.  Nelva Bush, MD 01/09/2020 6:53 AM

## 2020-01-09 ENCOUNTER — Encounter: Payer: Self-pay | Admitting: Internal Medicine

## 2020-02-16 ENCOUNTER — Other Ambulatory Visit: Payer: Self-pay

## 2020-02-16 MED ORDER — ATORVASTATIN CALCIUM 40 MG PO TABS: 40.0000 mg | ORAL_TABLET | Freq: Every day | ORAL | 3 refills | Status: DC

## 2020-02-16 NOTE — Telephone Encounter (Signed)
*  STAT* If patient is at the pharmacy, call can be transferred to refill team.   1. Which medications need to be refilled? (please list name of each medication and dose if known) Lipitor  2. Which pharmacy/location (including street and city if local pharmacy) is medication to be sent to? [facility]  3. Do they need a 30 day or 90 day supply? 90

## 2020-06-11 ENCOUNTER — Other Ambulatory Visit: Payer: Self-pay | Admitting: Internal Medicine

## 2020-06-15 ENCOUNTER — Other Ambulatory Visit: Payer: Self-pay | Admitting: Internal Medicine

## 2020-06-17 ENCOUNTER — Telehealth: Payer: Self-pay | Admitting: Internal Medicine

## 2020-06-17 NOTE — Telephone Encounter (Signed)
Patient reports she never stopped t/he Plavix. According to Neurology note from 03/01/20: "Discussed cardiac monitoring, and this is still being planned. Recommended patient stay on dual antiplatelet therapy until cardiac monitoring is completed given the distribution of the strokes and patient's concern for having additional strokes off of Plavix. (Patient returned to office on Plavix today, and it was not discontinued.)  Reviewed that recent evidence suggest dual anti platelet therapy may not be any better in reducing cerebrovascular ischemic injury risk and may increase bleeding complications compared to single agent after 21 days of initial event. After 3 weeks patient should discontinue Plavix and continue Aspirin.  "   Patient does not recall the whole conversation with neurologist that day.  Advised her she should call neurology as directed by Dr Serita Kyle entry earlier this day for further guidance on the anti-platelet therapy. Instructed patient to let us know if she wishes to follow up with any further cardiac testing recommendations.  She was appreciative.

## 2020-06-17 NOTE — Telephone Encounter (Signed)
Plavix stopped by neurologist.  If further questions regarding antiplatelet therapy arise, they should be directed to her neurology team as [name] has not followed-up with our office as recommended.  Yvonne Kendall, MD [facility]

## 2020-06-17 NOTE — Telephone Encounter (Signed)
Patient calling to discuss meds as noted below.   She states she was never taken off plavix and the reason she did not do TEE is because she was scared of the procedure.   Please call to discuss

## 2020-06-17 NOTE — Telephone Encounter (Signed)
Routing to Dr. Alvira Philips at [employer] [facility] via Epic fax function

## 2020-06-17 NOTE — Telephone Encounter (Signed)
Called pt and LMTCB. Plavix is no longer on medication list. Looks like Dr. Alvira Philips (Neurology) stopped Plavix and told pt to continue ASA 81 mg d/t increased bleeding risk.   Routing to Marlin, Charity fundraiser and Dr. Okey Dupre as Lorain Childes.

## 2020-06-17 NOTE — Telephone Encounter (Signed)
*  STAT* If patient is at the pharmacy, call can be transferred to refill team.   1. Which medications need to be refilled? (please list name of each medication and dose if known) Plavix 75 MG 1 tablet daily (not on med list?)  2. Which pharmacy/location (including street and city if local pharmacy) is [facility] [address] [facility]  3. Do they need a 30 day or 90 day supply? 90 day

## 2020-09-12 ENCOUNTER — Emergency Department
Admission: EM | Admit: 2020-09-12 | Discharge: 2020-09-12 | Disposition: A | Discharge status: Home / Self Care | Payer: BC Managed Care – PPO | Attending: Emergency Medicine | Admitting: Emergency Medicine

## 2020-09-12 ENCOUNTER — Other Ambulatory Visit: Payer: Self-pay

## 2020-09-12 DIAGNOSIS — S8002XA Contusion of left knee, initial encounter: Secondary | ICD-10-CM | POA: Diagnosis not present

## 2020-09-12 DIAGNOSIS — Z7982 Long term (current) use of aspirin: Secondary | ICD-10-CM | POA: Insufficient documentation

## 2020-09-12 DIAGNOSIS — S40022A Contusion of left upper arm, initial encounter: Secondary | ICD-10-CM | POA: Diagnosis not present

## 2020-09-12 DIAGNOSIS — Y92410 Unspecified street and highway as the place of occurrence of the external cause: Secondary | ICD-10-CM | POA: Insufficient documentation

## 2020-09-12 DIAGNOSIS — Z79899 Other long term (current) drug therapy: Secondary | ICD-10-CM | POA: Diagnosis not present

## 2020-09-12 DIAGNOSIS — Z87891 Personal history of nicotine dependence: Secondary | ICD-10-CM | POA: Insufficient documentation

## 2020-09-12 DIAGNOSIS — S8992XA Unspecified injury of left lower leg, initial encounter: Secondary | ICD-10-CM | POA: Diagnosis present

## 2020-09-12 DIAGNOSIS — I10 Essential (primary) hypertension: Secondary | ICD-10-CM | POA: Diagnosis not present

## 2020-09-12 MED ORDER — CYCLOBENZAPRINE HCL 5 MG PO TABS: 5.0000 mg | ORAL_TABLET | Freq: Three times a day (TID) | ORAL | 0 refills | Status: AC | PRN

## 2020-09-12 NOTE — Discharge Instructions (Addendum)
Over-the-counter Tylenol as directed and Flexeril as needed.
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
## 2021-02-05 ENCOUNTER — Other Ambulatory Visit: Payer: Self-pay | Admitting: Cardiovascular Disease

## 2021-03-30 IMAGING — MR MR HEAD W/O CM
10 series · 48 of 48 positions shown · non-contrast
Comparison: None.

CLINICAL DATA: Headache with vomiting. Right arm numbness.

EXAM:
MRI HEAD WITHOUT CONTRAST
TECHNIQUE: Multiplanar, multiecho pulse sequences of the brain and surrounding
structures were obtained without intravenous contrast.

[Series 2: ax dwi_tracew · axial · 3.0mm · 1.31mm/px · z∈[-120,+33]mm · 7 of 48 slices shown]
[im 1/48]
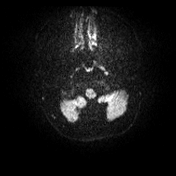
[im 8/48]
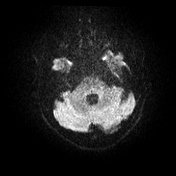
[im 16/48]
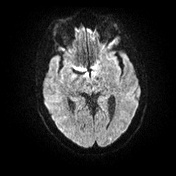
[im 24/48]
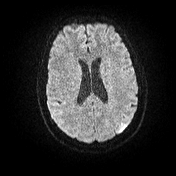
[im 32/48]
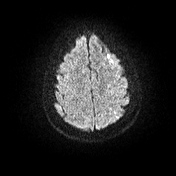
[im 40/48]
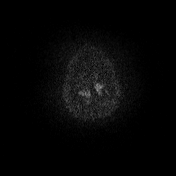
[im 48/48]
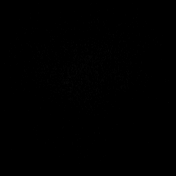

[Series 3: ax dwi_adc · axial · 3.0mm · 1.31mm/px · z∈[-120,+20]mm · 6 of 44 slices shown]
[im 1/44]
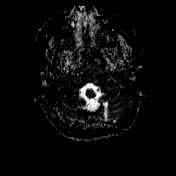
[im 9/44]
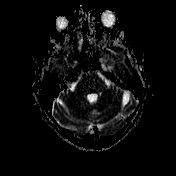
[im 18/44]
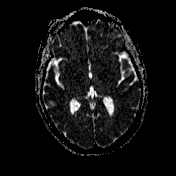
[im 26/44]
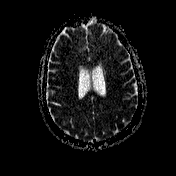
[im 35/44]
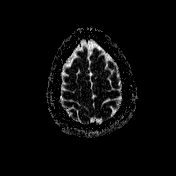
[im 44/44]
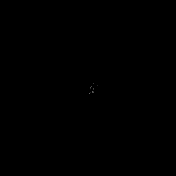

[Series 4: cor dwi_tracew · coronal · 5.0mm · 1.31mm/px · 6 of 38 slices shown]
[im 1/38]
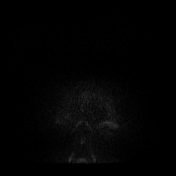
[im 8/38]
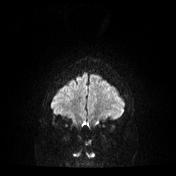
[im 15/38]
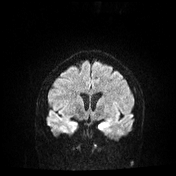
[im 23/38]
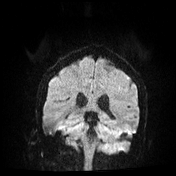
[im 30/38]
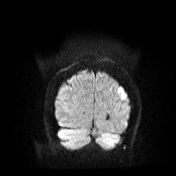
[im 38/38]
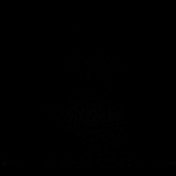

[Series 5: cor dwi_adc · coronal · 5.0mm · 1.31mm/px · 5 of 37 slices shown]
[im 1/37]
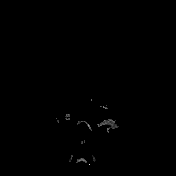
[im 10/37]
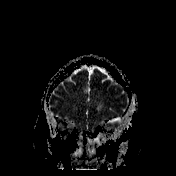
[im 19/37]
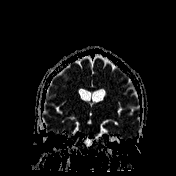
[im 28/37]
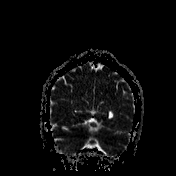
[im 37/37]
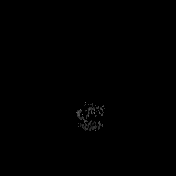

[Series 6: T1 · sagittal · 5.0mm · 0.94mm/px · 3 of 23 slices shown (1 of 2)]
[im 1/23]
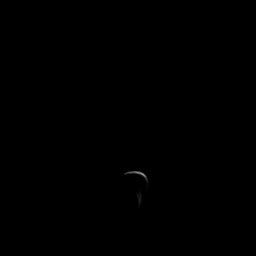
[im 12/23]
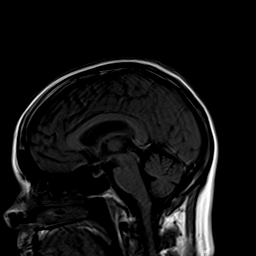
[im 23/23]
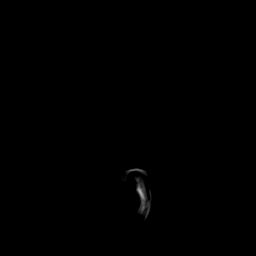

[Series 7: T2 · axial · 5.0mm · 0.45mm/px · z∈[-121,+33]mm · 4 of 27 slices shown (1 of 2)]
[im 1/27]
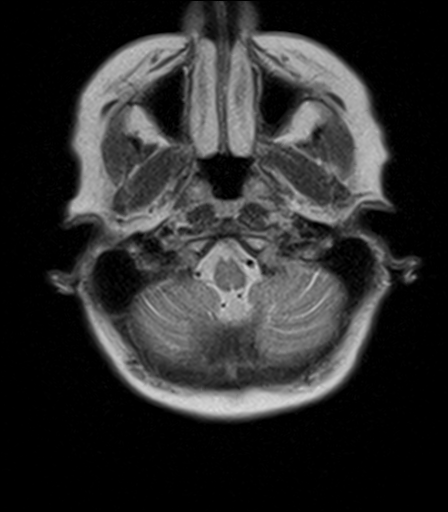
[im 9/27]
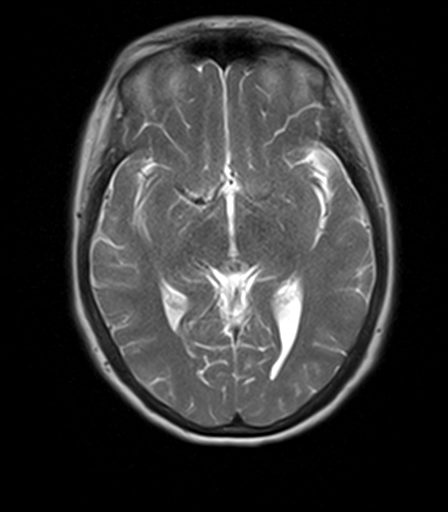
[im 18/27]
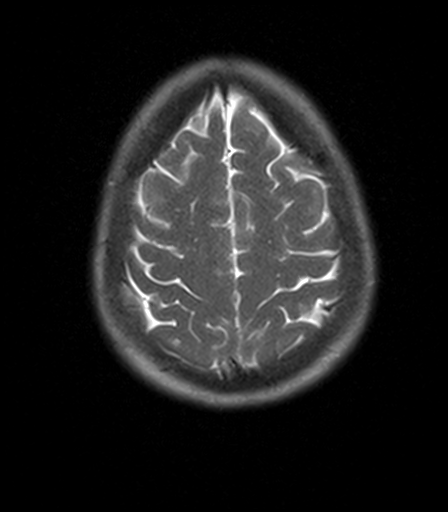
[im 27/27]
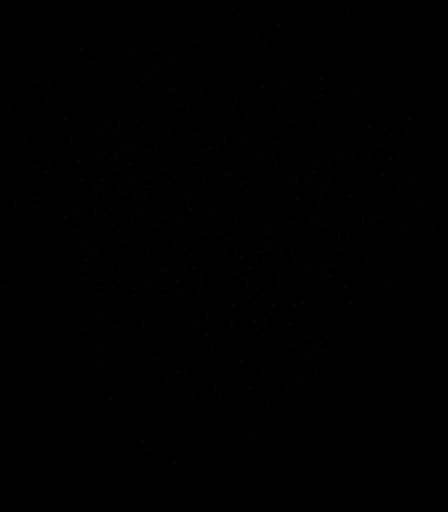

[Series 8: T2-star · axial · 5.0mm · 0.45mm/px · z∈[-121,+33]mm · 4 of 27 slices shown]
[im 1/27]
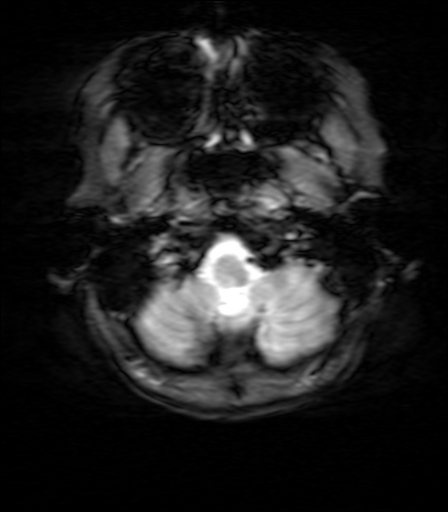
[im 9/27]
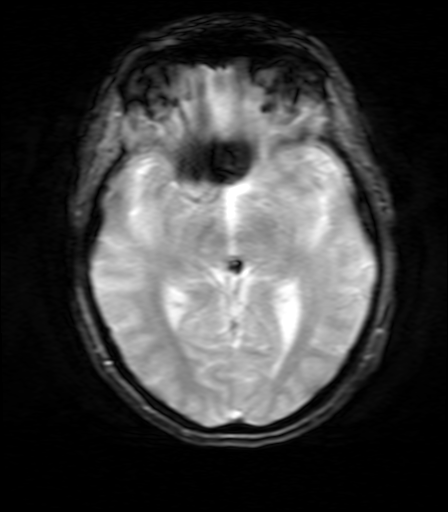
[im 18/27]
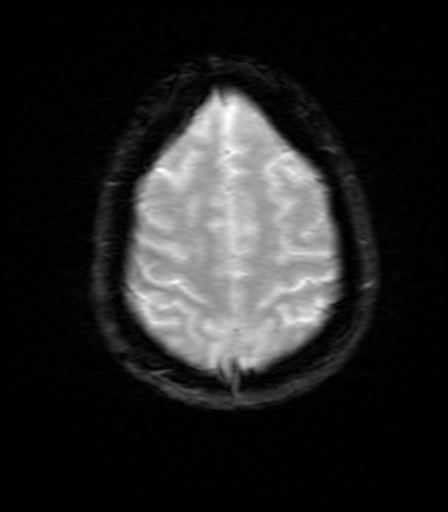
[im 27/27]
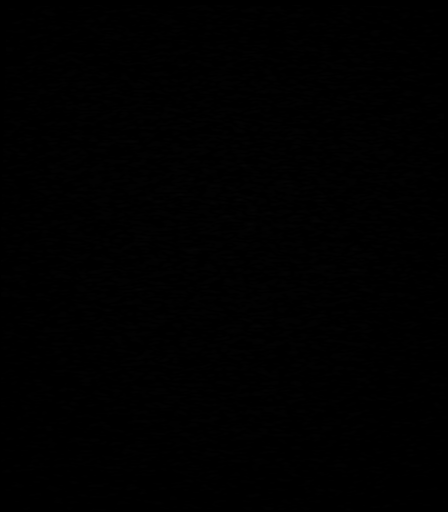

[Series 9: T1 · axial · 5.0mm · 0.90mm/px · z∈[-135,+19]mm · 4 of 27 slices shown (2 of 2)]
[im 1/27]
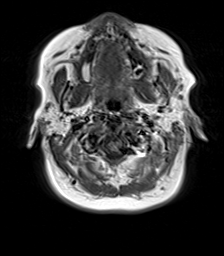
[im 9/27]
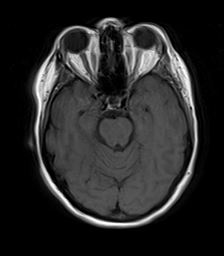
[im 18/27]
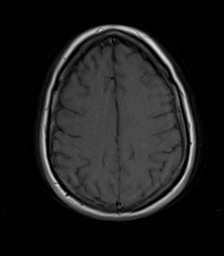
[im 27/27]
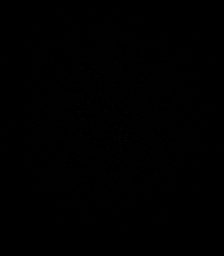

[Series 10: FLAIR · axial · 5.0mm · 1.20mm/px · z∈[-135,+20]mm · 4 of 27 slices shown]
[im 1/27]
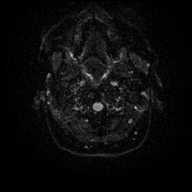
[im 9/27]
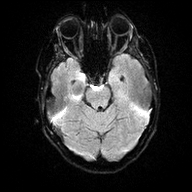
[im 18/27]
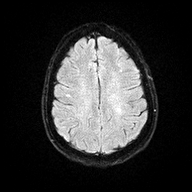
[im 27/27]
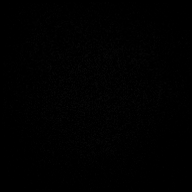

[Series 11: T2 · coronal · 5.0mm · 0.45mm/px · 5 of 31 slices shown (2 of 2)]
[im 1/31]
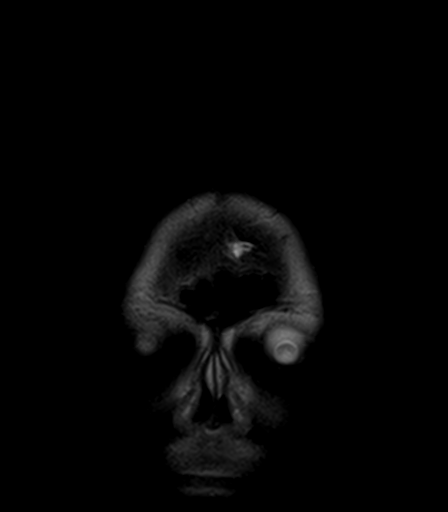
[im 8/31]
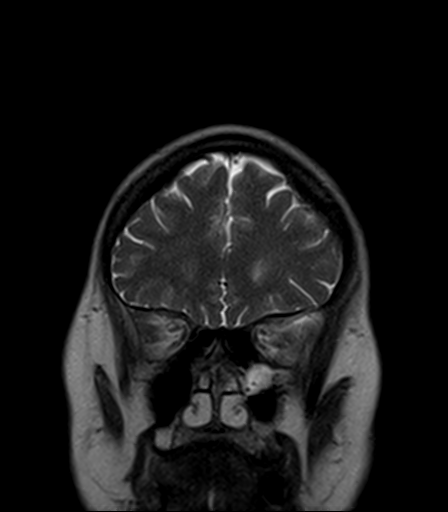
[im 16/31]
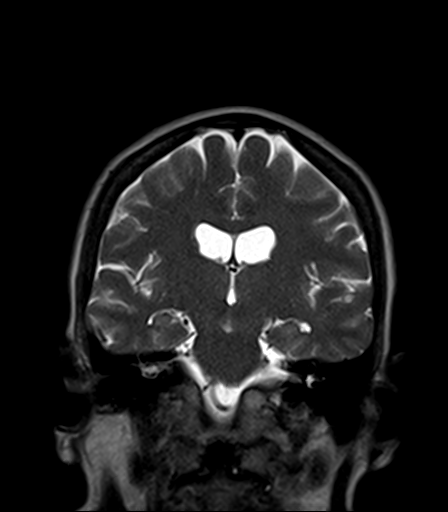
[im 23/31]
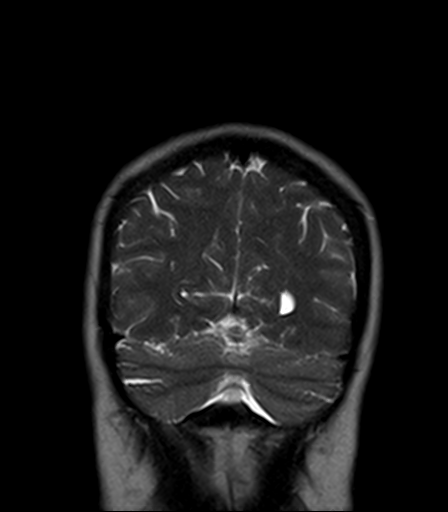
[im 31/31]
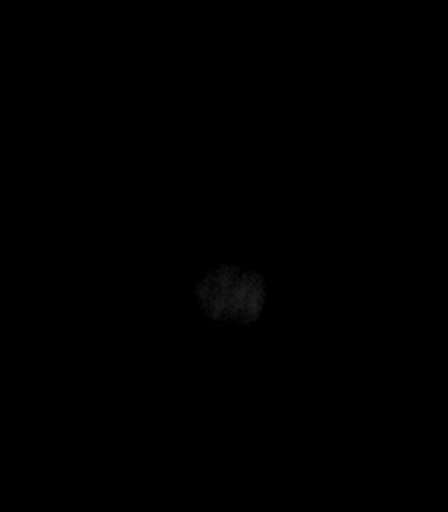

[48 of 48 positions shown; findings below may reference images not displayed]

FINDINGS: Brain: Multiple small foci of abnormal diffusion restriction within
the left hemisphere, including the left occipital lobe, left
parietal periatrial white matter and at the watershed zone between
the left anterior and middle cerebral arteries. No diffusion
abnormality in the right hemisphere or cerebellum. Multifocal white
matter hyperintensity, most commonly due to chronic ischemic
microangiopathy. Normal volume of CSF spaces. No chronic
microhemorrhage. Normal midline structures.

Vascular: Normal flow voids.

Skull and upper cervical spine: Normal marrow signal.

Sinuses/Orbits: Negative.

Other: None.
IMPRESSION: 1. Multiple small acute infarcts within the left hemisphere,
including the left occipital lobe, left parietal lobe and at the
watershed zone between the left anterior and middle cerebral
arteries.
2. No hemorrhage or mass effect.

## 2023-10-12 NOTE — Progress Notes (Signed)
 Ref Provider: Self  Assessment and Plan:   In most patients we give written parts of assessment and plan to patient under Patient Instructions/After Visit Summary. So some parts are directed to patient.  Dear [name], It was our pleasure to participate in your care via in person. We have typed up brief summary of what we discussed. Assessment & Plan Stroke with Multiple Embolic Infarcts Currently on dual antiplatelet therapy with aspirin  and Plavix , which she prefers to continue. No new stroke-like symptoms. Blood pressure and heart rate are well controlled. No abnormal bruising, bleeding, or new neurologic symptoms. Discussed risks of dual antiplatelet therapy, including bleeding and bruising, and the sparse evidence supporting its continued use. Patient prefers to continue current regimen. - Continue aspirin  and Plavix  - Schedule follow-up in one year or sooner if needed  Migraine with Aura Well controlled with long-acting propranolol. No significant side effects. Occasional breakthrough headaches triggered by illnesses like cold or flu. - Continue long-acting propranolol  Elevated Triglycerides Slightly elevated triglycerides, likely reflective of dietary intake. Encouraged to focus on green leafy vegetables and maintain a healthy diet. - Encourage dietary modifications focusing on green leafy vegetables  Prediabetes Slightly elevated A1c, indicating prediabetes. Encouraged to maintain a healthy lifestyle. - Encourage healthy lifestyle modifications  General Health Maintenance Blood pressure, heart rate, and oxygen levels are well controlled. Exercises daily and has good sunlight exposure. Recent blood work showed minor changes in alk phos and hemoglobin, but these are not concerning. Vitamin B12 and D levels are good. - Continue current medications and supplements - Follow up with PCP in March for a complete physical and repeat labs including lipids, A1c, thyroid, CBC, and  CMP  Follow-up - Schedule follow-up in one year with neurology - Follow up with PCP in March for a complete physical.  Return in about 1 year (around 10/11/2024).  Interim History date 10/12/2023   Samantha Powers is a 58 y.o. female here for treatment and evaluation of Migraine and Cerebrovascular Accident, unaccompanied.    Samantha Powers last visit was on 07/05/2022  History of Present Illness Samantha Powers is a 58 year old female with a history of stroke and multiple embolic infarcts who presents for a routine follow-up.  She has a history of stroke and multiple embolic infarcts and is currently on dual antiplatelet therapy with aspirin  and Plavix . No new stroke-like symptoms such as sudden visual changes, weakness, numbness, or changes in gait or speech. Blood pressure and heart rate are well controlled. No abnormal bruising, bleeding, or blood in urine, stool, or sputum. No new neurologic symptoms concerning for bleeds.  She has a history of migraine with aura, managed with long-acting propranolol. Headaches are well controlled with no side effects like lightheadedness, dizziness, or excessive fatigue. Occasional headaches occur, triggered by illnesses like a cold or flu, but these are consistent with her usual headaches.  She has elevated triglycerides and a slightly elevated A1c, indicating prediabetes. She has made dietary changes and lost a pant size, although her weight has increased due to muscle gain from lifting weights. She exercises daily and maintains a good appetite and sleep pattern.  Her vitamin B12 and vitamin D  levels are good, and she continues to take supplements for these.  Disease Summary: (Aggregate of information from previous visits)   Samantha Powers is a right handed 58 y.o. female here for evaluation of Migraine and Cerebrovascular Accident , referred by Self.  Stroke - Multiple embolic looking acute infarcts involving left occipital  lobe, left parietal lobe and at the  watershed zone between the left anterior and middle cerebral arteries: Patient presented to [facility] Emergency Room on 11/20/2019 for headache, vomiting, jumbled words, and vision changes in right eye. She also reports word finding difficulty. When she arrived to the Emergency Room she noticed her hands started to draw up.  While there they did a stroke work up which was positive for multiple small acute infarcts within the left hemisphere including the left occipital lobe, left parietal lobe and at the watershed zone between the left anterior and middle cerebral arteries.  She was started on aspirin  and Plavix .  She did not receive TPA.  Since stroke she states she sees a bright light in her eye when she reads.  Samantha Powers has known stroke risk factors of high cholesterol and migraine with aura.  She reports no known history of atrial fibrillation, sickle cell disease or trait, post-menopausal hormone replacement therapy, drug abuse, periodontal disease, high CRP, elevated lipoprotein A, increased homocysteine level, peripheral vascular disease, DVT, PE, coagulopathy, Cox II inhibitor use.   Patient is a former smoker, started around age 21. She smoked about half pack to 1 pack per day. She stopped around age 14.   Has been evaluated by Cardiology (Dr. Mady) who assigned as needed follow-up per patient. Reviewed note. Patient was to follow-up with clinic after TEE. No follow-up appointment was scheduled. Will encourage her to follow-up with Dr. Ulysses office to be sure no further action is needed.  - Continue aggressive vascular risk factor control - Continue Aspirin  81 mg  - Continue Plavix /Clopidogrel  75 mg  - Patient understands risks/benefits of DAPT which she is tolerating well  - Pt was not on antiplatelet therapy prior to stroke, she was stared on Aspirin  and Plavix  after the event. Continue dual antiplatelet therapy per patient preference  MRI brain 11/20/2019 - Multiple  small acute infarcts within the left hemisphere, including the left occipital lobe, left parietal lobe and at the watershed zone between the left anterior and middle cerebral arteries. No hemorrhage or mass effect  CT Angio of head and neck: 11/20/2019 - CT head: Known small acute infarcts within the left cerebral hemisphere involving the left occipital lobe, left parietal lobe and at the watershed zone between the left ACA and MCA vascular territories, some of which were better appreciated on same-day brain MRI. No hemorrhagic conversion. No interval infarct is identified. No significant mass effect.  CTA neck: Predominantly soft plaque within the distal left CCA and proximal left ICA. Resultant 50-60% stenosis of the proximal left ICA. The right common carotid artery, right internal carotid artery and bilateral vertebral arteries are patent within the neck without significant stenosis. Cervical chain lymph nodes are diffusely prominent in number throughout the neck. Findings are nonspecific and clinical correlation is recommended.  CTA head: No intracranial large vessel occlusion or proximal high-grade arterial stenosis.   ECHO 11/20/2019 - Left ventricular ejection fraction, by estimation, is 65 to 70%. The left ventricle has normal function. The left ventricle has no regional wall motion abnormalities. Left ventricular diastolic parameters are consistent with Grade I diastolic  dysfunction (impaired relaxation). Right ventricular systolic function is normal. The right ventricular size is normal.  The mitral valve is normal in structure. No evidence of mitral valve regurgitation.  The aortic valve is normal in structure. Aortic valve regurgitation is not visualized.   - Patient had hypercoagulable work up in the Emergency Room which was negative.  Labs included in work up included:  - CBC, ESR, CRP - Factor V Leiden mutation - Antithrombin III functional assay - MTHFR gene mutation - Protein C and S  activity (functional assay) - Homocysteine - Lupus anticoagulant comprehensive - Antiphospholipid syndrome (includes aPTT, PT, INR, Thrombin time, Dilute Viper Venom Time, Hexagonal phase phospholipid, Anticoardiolipin antibody IgG and IgM, Beta - 2 Glycoprotein IgG and IgM,   Migraine headaches: Patient has a history of migraine headache with aura that started in her 20's. She has associated nausea and vomiting. She also has light sensitivity.   Medications tried Preventive: Propranolol LA  Rescue: Phenergan, Tylenol , Avoid -Triptans given stroke history   Snoring: Patient does snore while sleeping. She states she sleeps well.   Reviewed notes from Emergency Room on 11/20/2019:  58 y.o. female with no known PMH presenting with headache, nausea, vomiting and difficulty with speech. Patient now at baseline. MRI of the brain personally reviewed and shows multiple small infarcts in the left hemisphere. 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 Patient on no antiplatelet therapy prior to admission. Has not seen a physician in quite some time. CTA shows soft plaque in the distal left CCA and proximal left ICA at 50-60% stenosis. Echocardiogram shows no cardiac source of emboli with an EF of 65-70%. A1c 5.7, LDL 16 Snoring - without evidence of Obstructive Sleep Apnea on Home Sleep Study (11/26/2019)   Vitamin D  + Vitamin B12 Deficiency  Physical Exam   Vitals Vitals:   10/12/23 0930  BP: 126/74  Pulse: 80  SpO2: 100%  Weight: 81.2 kg (179 lb)  Height: 160 cm (5' 3)   Body mass index is 31.71 kg/m.  Physical Exam MUSCULOSKELETAL: Normal muscle strength in upper and lower extremities.   NEUROLOGICAL: Cranial nerves grossly intact based on facial movements, eye movements, and verbal commands. No evidence of vertigo with eye tracking. Normal cerebellar function demonstrated by normal gait and ability to walk on heels. Normal strength and coordination demonstrated by ability to raise eyebrows, close eyes  tightly, open them, smile big, stick out tongue, and say 'ah'. Normal upper extremity strength demonstrated by ability to squeeze hands, pull and push against resistance. Normal lower extremity strength demonstrated by ability to lift legs towards ceiling.  (Some of the exam changes noted are from previous clinical observations)  General Exam Patient looks appropriate of age, well built, nourished and appropriately groomed.   To maintain physical distancing during COVID 19 pandemic, below cardiovascular and pulmonary exam was deferred.  Cardiovascular Exam: S1, S2 heart sounds present Carotid exam revealed no bruit Lung exam was clear to auscultation  Neurological Exam     Alert,     Oriented to time, place, person     Attention span and concentration seemed appropriate     Language seemed intact (naming, spontaneous speech, comprehension)     Pupils were reactive to light, extra-ocular movements are normal     Visible parts of face is symmetric     Hearing is equal bilaterally Palate, uvula and tongue movement and other oral inspection was not done due to COVID 19 pandemic.       -     Muscle strength in all extremities seemed normal on observation.     Deep tendon reflexes were symmetric      Sensations were intact to light touch in all extremities     Gait and station are normal when examined in small exam room   Current Outpatient Medications on File Prior to Visit  Medication Sig Dispense Refill  . aspirin  81 MG EC tablet Take by mouth    . atorvastatin  (LIPITOR) 40 MG tablet TAKE 1 TABLET(40 MG) BY MOUTH EVERY DAY 90 tablet 3  . cholecalciferol 1000 unit tablet Take by mouth    . clopidogreL  (PLAVIX ) 75 mg tablet TAKE 1 TABLET(75 MG) BY MOUTH EVERY DAY 90 tablet 3  . cyanocobalamin  (VITAMIN B12) 1000 MCG tablet Take 1,000 mcg by mouth once daily     No current facility-administered medications on file prior to visit.    Past Medical History:  Past Medical History:   Diagnosis Date  . History of stroke   . Hyperlipidemia   . Migraine headache     Past Surgical History: No past surgical history on file. Family History:  Family History  Problem Relation Name Age of Onset  . Cancer Mother    . High blood pressure (Hypertension) Mother    . Cancer Father    . Migraines Daughter     Social History:  Social History   Socioeconomic History  . Marital status: Married  Tobacco Use  . Smoking status: Former  . Smokeless tobacco: Never  Vaping Use  . Vaping status: Never Used  Substance and Sexual Activity  . Alcohol use: Not Currently  . Drug use: Not Currently  . Sexual activity: Yes   Social Drivers of Health   Housing Stability: Unknown (10/11/2023)   Housing Stability Vital Sign   . Homeless in the Last Year: No   Allergies:  Allergies  Allergen Reactions  . Penicillins Swelling and Rash    As a child; does not remember, but told by mother that she had swelling and rash.  Samantha Powers Shellfish Containing Products Rash and Swelling    Mild swelling   This note has been created using automated tools and reviewed for accuracy by KAITLIN PAICH.  Parts of this note were created with use of Dragon dictation services. Please note any typographical errors are unintentional. I apologize for any typographical errors that were not detected and corrected.   Return in about 1 year (around 10/11/2024). The patient will contact us  earlier if severity of neurologic symptoms increases or changes. This may or may not, depending on description of symptoms, necessitate an earlier appointment.    Payor: [employer] / Plan: [employer] CHOICE PLUS / Product Type: POS /   I personally performed the service, non-incident to. (WP)   KAITLIN PAICH, PA     Kaitlin Paich, PA-C Board Certified by [facility] - Neurology  A [facility]

## 2023-10-19 NOTE — Progress Notes (Signed)
 History of Present Illness:   Samantha Powers is a 58 y.o. female here for concern for UTI.  She reports onset of symptoms last night, with blood in her urine.  She has a history of recurrent UTIs, and sometimes has hematuria.  She denies any burning with urination at this time.  She does report some urgency.  She denies any frequency.  She denies any history of kidney stones.  She denies any fever, chills, nausea, vomiting.  Previous cultures showed sensitivities to Cipro.  She states that she normally gets Cipro.  She has a penicillin allergy.   Past Medical History:   Past Medical History:  Diagnosis Date  . History of stroke   . Hyperlipidemia   . Migraine headache     Past Surgical History:  History reviewed. No pertinent surgical history.  Allergies:   Allergies  Allergen Reactions  . Penicillins Swelling and Rash    As a child; does not remember, but told by mother that she had swelling and rash.  SABRA Shellfish Containing Products Rash and Swelling    Mild swelling    Current Medications:   Prior to Admission medications   Medication Sig Taking? Last Dose  aspirin  81 MG EC tablet Take by mouth Yes Taking  atorvastatin  (LIPITOR) 40 MG tablet TAKE 1 TABLET(40 MG) BY MOUTH EVERY DAY Yes Taking  cholecalciferol 1000 unit tablet Take by mouth Yes Taking  clopidogreL  (PLAVIX ) 75 mg tablet TAKE 1 TABLET(75 MG) BY MOUTH EVERY DAY Yes Taking  cyanocobalamin  (VITAMIN B12) 1000 MCG tablet Take 1,000 mcg by mouth once daily Yes Taking  propranoloL (INDERAL LA) 60 MG LA capsule Take 1 capsule (60 mg total) by mouth at bedtime Yes Taking  ciprofloxacin HCl (CIPRO) 500 MG tablet Take 1 tablet (500 mg total) by mouth 2 (two) times daily for 7 days      Family History:   Family History  Problem Relation Name Age of Onset  . Cancer Mother    . High blood pressure (Hypertension) Mother    . Cancer Father    . Migraines Daughter      Social History:   Social History    Socioeconomic History  . Marital status: Married  Tobacco Use  . Smoking status: Former  . Smokeless tobacco: Never  Vaping Use  . Vaping status: Never Used  Substance and Sexual Activity  . Alcohol use: Not Currently  . Drug use: Not Currently  . Sexual activity: Yes   Social Drivers of Health   Housing Stability: Unknown (10/11/2023)   Housing Stability Vital Sign   . Homeless in the Last Year: No    Review of Systems:   Review of Systems  Constitutional:  Negative for chills and fever.  HENT:  Negative for congestion.   Eyes:  Negative for visual disturbance.  Respiratory:  Negative for cough and shortness of breath.   Gastrointestinal:  Negative for abdominal pain, nausea and vomiting.  Genitourinary:  Positive for frequency and hematuria. Negative for dysuria and urgency.  Skin:  Negative for rash.  All other systems reviewed and are negative.   Vitals:   Vitals:   10/19/23 1152  BP: (!) 145/90  Pulse: 89  Temp: 37.1 C (98.8 F)  TempSrc: Oral  SpO2: 98%  Weight: 81.5 kg (179 lb 9.6 oz)  Height: 162.6 cm (5' 4)     Body mass index is 30.83 kg/m.  Physical Exam:   Physical Exam Vitals and nursing note reviewed.  Constitutional:  Appearance: She is well-developed.  HENT:     Head: Normocephalic and atraumatic.     Right Ear: Hearing, tympanic membrane, ear canal and external ear normal.     Left Ear: Hearing, tympanic membrane, ear canal and external ear normal.     Nose: Nose normal.     Mouth/Throat:     Pharynx: Oropharynx is clear. Uvula midline.  Eyes:     General: Lids are normal. Vision grossly intact.     Conjunctiva/sclera: Conjunctivae normal.     Pupils: Pupils are equal, round, and reactive to light.  Cardiovascular:     Rate and Rhythm: Normal rate and regular rhythm.     Pulses: Normal pulses.     Heart sounds: Normal heart sounds. No murmur heard. Pulmonary:     Effort: Pulmonary effort is normal.     Breath sounds:  Normal breath sounds. No wheezing.  Abdominal:     General: Bowel sounds are normal.     Palpations: Abdomen is soft.     Tenderness: There is no abdominal tenderness.     Comments: Gastrointestinal system examined as above.  Musculoskeletal:        General: Normal range of motion.     Cervical back: Full passive range of motion without pain, normal range of motion and neck supple.  Skin:    General: Skin is warm and dry.  Neurological:     General: No focal deficit present.     Mental Status: She is alert and oriented to person, place, and time.  Psychiatric:        Attention and Perception: Attention and perception normal.        Mood and Affect: Mood and affect normal.        Speech: Speech normal.        Behavior: Behavior normal. Behavior is cooperative.        Thought Content: Thought content normal.     Assessment and Plan:   Results for orders placed or performed in visit on 10/19/23  Urinalysis w/Microscopic  Result Value Ref Range   Color Colorless Colorless, Straw, Light Yellow, Yellow, Dark Yellow   Clarity Clear Clear   Specific Gravity 1.003 (L) 1.005 - 1.030   pH, Urine 7.5 5.0 - 8.0   Protein, Urinalysis Trace (!) Negative mg/dL   Glucose, Urinalysis Negative Negative mg/dL   Ketones, Urinalysis Negative Negative mg/dL   Blood, Urinalysis 3+ (!) Negative   Nitrite, Urinalysis Negative Negative   Leukocyte Esterase, Urinalysis Negative Negative   Bilirubin, Urinalysis Negative Negative   Urobilinogen, Urinalysis 0.2 0.2 - 1.0 mg/dL   WBC, UA 2 <=5 /hpf   Red Blood Cells, Urinalysis 23 (H) <=3 /hpf   Bacteria, Urinalysis 0-5 0 - 5 /hpf   Squamous Epithelial Cells, Urinalysis 0 /hpf    Diagnoses and all orders for this visit:  Acute UTI  Hematuria, unspecified type -     Urinalysis w/Microscopic -     Urine Culture, Routine - [facility]  Other orders -     ciprofloxacin HCl (CIPRO) 500 MG tablet; Take 1 tablet (500 mg total) by mouth 2 (two) times  daily for 7 days    Patient Instructions  Take medications as directed. Increase free fluid intake. Avoid caffeine . We will call you with the results of the culture and further instructions, if indicated. Return to care should symptoms persist, or worsen in any way.   Portions of this note were created using dictation software and may  contain typographical errors.   Patient received an After Visit Summary

## 2023-11-23 ENCOUNTER — Ambulatory Visit: Payer: BC Managed Care – PPO | Admitting: Nurse Practitioner

## 2024-02-28 ENCOUNTER — Encounter: Payer: Self-pay | Admitting: Nurse Practitioner

## 2024-02-28 ENCOUNTER — Ambulatory Visit (INDEPENDENT_AMBULATORY_CARE_PROVIDER_SITE_OTHER): Admitting: Nurse Practitioner

## 2024-02-28 VITALS — BP 130/84 | HR 76 | Temp 97.9°F | Ht 63.0 in | Wt 182.0 lb

## 2024-02-28 DIAGNOSIS — E785 Hyperlipidemia, unspecified: Secondary | ICD-10-CM

## 2024-02-28 DIAGNOSIS — G43109 Migraine with aura, not intractable, without status migrainosus: Secondary | ICD-10-CM

## 2024-02-28 DIAGNOSIS — Z8673 Personal history of transient ischemic attack (TIA), and cerebral infarction without residual deficits: Secondary | ICD-10-CM

## 2024-02-28 DIAGNOSIS — Z0000 Encounter for general adult medical examination without abnormal findings: Secondary | ICD-10-CM

## 2024-02-28 DIAGNOSIS — R7303 Prediabetes: Secondary | ICD-10-CM

## 2024-02-28 DIAGNOSIS — E538 Deficiency of other specified B group vitamins: Secondary | ICD-10-CM | POA: Diagnosis not present

## 2024-02-28 DIAGNOSIS — Z780 Asymptomatic menopausal state: Secondary | ICD-10-CM

## 2024-02-28 DIAGNOSIS — I10 Essential (primary) hypertension: Secondary | ICD-10-CM | POA: Diagnosis not present

## 2024-02-28 DIAGNOSIS — Z803 Family history of malignant neoplasm of breast: Secondary | ICD-10-CM

## 2024-02-28 DIAGNOSIS — Z1211 Encounter for screening for malignant neoplasm of colon: Secondary | ICD-10-CM

## 2024-02-28 DIAGNOSIS — Z1231 Encounter for screening mammogram for malignant neoplasm of breast: Secondary | ICD-10-CM

## 2024-02-28 LAB — CBC WITH DIFFERENTIAL/PLATELET
Basophils Absolute: 0.1 10*3/uL (ref 0.0–0.1)
Basophils Relative: 0.7 % (ref 0.0–3.0)
Eosinophils Absolute: 0.2 10*3/uL (ref 0.0–0.7)
Eosinophils Relative: 2.6 % (ref 0.0–5.0)
HCT: 43.4 % (ref 36.0–46.0)
Hemoglobin: 14.6 g/dL (ref 12.0–15.0)
Lymphocytes Relative: 27.8 % (ref 12.0–46.0)
Lymphs Abs: 2.1 10*3/uL (ref 0.7–4.0)
MCHC: 33.7 g/dL (ref 30.0–36.0)
MCV: 88 fl (ref 78.0–100.0)
Monocytes Absolute: 0.7 10*3/uL (ref 0.1–1.0)
Monocytes Relative: 9.5 % (ref 3.0–12.0)
Neutro Abs: 4.6 10*3/uL (ref 1.4–7.7)
Neutrophils Relative %: 59.4 % (ref 43.0–77.0)
Platelets: 303 10*3/uL (ref 150.0–400.0)
RBC: 4.93 Mil/uL (ref 3.87–5.11)
RDW: 13.4 % (ref 11.5–15.5)
WBC: 7.7 10*3/uL (ref 4.0–10.5)

## 2024-02-28 LAB — MICROALBUMIN / CREATININE URINE RATIO
Creatinine,U: 67.7 mg/dL
Microalb Creat Ratio: 34.5 mg/g — ABNORMAL HIGH (ref 0.0–30.0)
Microalb, Ur: 2.3 mg/dL — ABNORMAL HIGH (ref 0.0–1.9)

## 2024-02-28 LAB — VITAMIN D 25 HYDROXY (VIT D DEFICIENCY, FRACTURES): VITD: 41.07 ng/mL (ref 30.00–100.00)

## 2024-02-28 LAB — HEMOGLOBIN A1C: Hgb A1c MFr Bld: 6.1 % (ref 4.6–6.5)

## 2024-02-28 LAB — TSH: TSH: 1.58 u[IU]/mL (ref 0.35–5.50)

## 2024-02-28 LAB — VITAMIN B12: Vitamin B-12: 440 pg/mL (ref 211–911)

## 2024-02-28 LAB — COMPREHENSIVE METABOLIC PANEL WITH GFR
ALT: 29 U/L (ref 0–35)
AST: 24 U/L (ref 0–37)
Albumin: 4.4 g/dL (ref 3.5–5.2)
Alkaline Phosphatase: 124 U/L — ABNORMAL HIGH (ref 39–117)
BUN: 13 mg/dL (ref 6–23)
CO2: 28 meq/L (ref 19–32)
Calcium: 9.3 mg/dL (ref 8.4–10.5)
Chloride: 103 meq/L (ref 96–112)
Creatinine, Ser: 0.73 mg/dL (ref 0.40–1.20)
GFR: 91.79 mL/min (ref >60.00)
Glucose, Bld: 112 mg/dL — ABNORMAL HIGH (ref 70–99)
Potassium: 4.3 meq/L (ref 3.5–5.1)
Sodium: 139 meq/L (ref 135–145)
Total Bilirubin: 0.4 mg/dL (ref 0.2–1.2)
Total Protein: 7.4 g/dL (ref 6.0–8.3)

## 2024-02-28 LAB — LIPID PANEL
Cholesterol: 152 mg/dL (ref 0–200)
HDL: 42.3 mg/dL (ref >39.00)
LDL Cholesterol: 88 mg/dL (ref 0–99)
NonHDL: 109.95
Total CHOL/HDL Ratio: 4
Triglycerides: 110 mg/dL (ref 0.0–149.0)
VLDL: 22 mg/dL (ref 0.0–40.0)

## 2024-02-28 NOTE — Patient Instructions (Addendum)
 YOUR MAMMOGRAM IS DUE, PLEASE CALL AND GET THIS SCHEDULED! [facility] - call [phone]    Please monitor your blood pressure 2-3 times a week at the same time and  bring the readings at next visit.   VISIT SUMMARY:  Today, you came in to establish care. We reviewed your history of stroke, migraines, hypertension, prediabetes, hyperlipidemia, and recurrent UTIs. We also discussed the importance of preventive screenings and vaccinations.  YOUR PLAN:  STROKE: You had a stroke in 2021 and still experience some arm weakness and hand numbness. -Continue taking Plavix  and aspirin  as prescribed.  MIGRAINE: Your migraines are well-controlled with propranolol. -Continue taking propranolol for migraine prevention.  HYPERTENSION: Your blood pressure is well-controlled with propranolol. -Continue monitoring your blood pressure at home once or twice a week. -Continue taking propranolol.  HYPERLIPIDEMIA: You have high cholesterol and triglycerides. -We will order a fasting lipid panel. -Consider taking fish oil capsules if your triglycerides remain high. -Reduce shrimp intake if your triglycerides are high.  PREDIABETES: You have prediabetes with a previous A1c of 6.1%. -We will order fasting labs to check your current A1c and lipid profile. -Continue regular meals and exercise. -We may discuss dietary changes if your triglycerides are high.  RECURRENT URINARY TRACT INFECTIONS (UTIS): You have recurrent UTIs every 3-4 months. -Increase your water intake. -Maintain proper hygiene practices to prevent UTIs.  GENERAL HEALTH MAINTENANCE: You are due for preventive screenings and vaccinations. -We will order a mammogram due to your family history of breast cancer. -We will order a colonoscopy as part of routine screening. -We will discuss Pap smear options and schedule as appropriate. -You should get a tetanus booster vaccination. -Consider getting a flu vaccination.  FOLLOW-UP:  We need to monitor your health conditions and review lab results. -Schedule a follow-up in one month to review lab results. -Contact us  sooner if lab results are concerning.

## 2024-02-28 NOTE — Progress Notes (Signed)
 New Patient Office Visit  Subjective   Patient ID: Samantha Powers, female    DOB: 27-Feb-1966  Age: 58 y.o. MRN: [id number]  CC:  Chief Complaint  Patient presents with   Establish Care  Discussed the use of a AI scribe software for clinical note transcription with the patient, who gave verbal consent to proceed.   HPI Samantha Powers presents to establish care.  She has not been to PCP for a while.    She had history of stroke in 2021 and is followed by neurologist at [facility]. has occasional arm weakness and intermittent hand tingling. She takes Plavix  and aspirin .   Her migraines have improved with propranolol, and she rarely has headaches.   Blood pressure is well-controlled at home, with readings in the 120s/60s-70s. She monitors it once or twice a week.  She is prediabetic with a previous A1c of 6.1. She exercises at the [facility] with her daughter two to three times a week and follows a Mediterranean diet but struggles with weight loss. Her triglycerides were previously elevated.   She has recurrent UTIs every three to four months but has not had a recent episode. She maintains preventive measures like hydration and hygiene. She drinks one cup of coffee in the morning and water throughout the day.  Family history includes breast cancer in her mother and aunt. She has not had a mammogram in several years or a Pap smear since her daughter was eight. She quit smoking in 2008 and does not consume alcohol. She is allergic to penicillin and sleeps 6-8 hours per night.  Health Maintenance  Topic Date Due   Hepatitis C Screening  Never done   DTaP/Tdap/Td (1 - Tdap) Never done   Hepatitis B Vaccines (1 of 3 - 19+ 3-dose series) Never done   Cervical Cancer Screening (HPV/Pap Cotest)  Never done   Colonoscopy  Never done   MAMMOGRAM  Never done   Zoster Vaccines- Shingrix (1 of 2) Never done   COVID-19 Vaccine (1 - 2024-25 season) Never done   INFLUENZA VACCINE  04/04/2024   HIV  Screening  Completed   HPV VACCINES  Aged Out   Meningococcal B Vaccine  Aged Out       Topic Date Due   Hepatitis B Vaccines (1 of 3 - 19+ 3-dose series) Never done    Outpatient Encounter Medications as of 02/28/2024  Medication Sig   acetaminophen  (TYLENOL ) 500 MG tablet Take 1,000 mg by mouth every 6 (six) hours as needed (pain.).   aspirin  EC 81 MG EC tablet Take 1 tablet (81 mg total) by mouth daily.   atorvastatin  (LIPITOR) 40 MG tablet TAKE 1 TABLET BY MOUTH DAILY AT 6 PM.   atorvastatin  (LIPITOR) 40 MG tablet Take 40 mg by mouth.   Cholecalciferol (VITAMIN D -1000 MAX ST) 25 MCG (1000 UT) tablet Take by mouth.   ciprofloxacin (CIPRO) 500 MG tablet Take 500 mg by mouth 2 (two) times daily.   clopidogrel  (PLAVIX ) 75 MG tablet Take 75 mg by mouth daily.   cyanocobalamin  (VITAMIN B12) 1000 MCG tablet Take 1,000 mcg by mouth.   propranolol ER (INDERAL LA) 60 MG 24 hr capsule Take 60 mg by mouth at bedtime.    propranolol ER (INDERAL LA) 60 MG 24 hr capsule Take 60 mg by mouth.   Vitamin D , Ergocalciferol , (DRISDOL) 1.25 MG (50000 UNIT) CAPS capsule Take 50,000 Units by mouth every Thursday.    cyclobenzaprine  (FLEXERIL ) 5 MG tablet Take  1 tablet (5 mg total) by mouth 3 (three) times daily as needed.   promethazine (PHENERGAN) 25 MG tablet Take 25 mg by mouth every 6 (six) hours as needed for nausea or vomiting.   No facility-administered encounter medications on file as of 02/28/2024.    Past Medical History:  Diagnosis Date   Allergy    Penicillin   Hyperlipidemia    Stroke (HCC) 11/19/19    Past Surgical History:  Procedure Laterality Date   CESAREAN SECTION     TUBAL LIGATION  08/16/2007    Family History  Problem Relation Age of Onset   Hypertension Mother    COPD Mother    Kidney disease Mother    Lymphoma Father    Early death Father        lymphoma   Stroke Maternal Grandmother    Coronary artery disease Maternal Grandfather        CABG   Arthritis  Daughter    Arthritis Daughter     Social History   Socioeconomic History   Marital status: Married    Spouse name: Not on file   Number of children: Not on file   Years of education: Not on file   Highest education level: Some college, no degree  Occupational History   Not on file  Tobacco Use   Smoking status: Former    Current packs/day: 0.00    Average packs/day: 0.5 packs/day for 20.0 years (10.0 ttl pk-yrs)    Types: Cigarettes    Start date: 54    Quit date: 2008    Years since quitting: 17.5   Smokeless tobacco: Never  Vaping Use   Vaping status: Never Used  Substance and Sexual Activity   Alcohol use: Not Currently   Drug use: Never   Sexual activity: Yes    Birth control/protection: Post-menopausal  Other Topics Concern   Not on file  Social History Narrative   Not on file   Social Drivers of Health   Financial Resource Strain: Low Risk  (02/21/2024)   Overall Financial Resource Strain (CARDIA)    Difficulty of Paying Living Expenses: Not hard at all  Food Insecurity: No Food Insecurity (02/21/2024)   Hunger Vital Sign    Worried About Running Out of Food in the Last Year: Never true    Ran Out of Food in the Last Year: Never true  Transportation Needs: No Transportation Needs (02/21/2024)   PRAPARE - Administrator, Civil Service (Medical): No    Lack of Transportation (Non-Medical): No  Physical Activity: Insufficiently Active (02/21/2024)   Exercise Vital Sign    Days of Exercise per Week: 3 days    Minutes of Exercise per Session: 30 min  Stress: No Stress Concern Present (02/21/2024)   [employer] of Occupational Health - Occupational Stress Questionnaire    Feeling of Stress: Not at all  Social Connections: Moderately Integrated (02/21/2024)   Social Connection and Isolation Panel    Frequency of Communication with Friends and Family: More than three times a week    Frequency of Social Gatherings with Friends and Family: Twice a  week    Attends Religious Services: 1 to 4 times per year    Active Member of [employer] or Organizations: No    Attends Engineer, structural: Not on file    Marital Status: Married  Catering manager Violence: Not on file    ROS Negative unless indicated in HPI.      Objective  BP 130/84   Pulse 76   Temp 97.9 F (36.6 C)   Ht 5' 3 (1.6 m)   Wt 182 lb (82.6 kg)   SpO2 99%   BMI 32.24 kg/m   Physical Exam Constitutional:      Appearance: Normal appearance.  HENT:     Right Ear: Tympanic membrane normal.     Left Ear: Tympanic membrane normal.     Mouth/Throat:     Mouth: Mucous membranes are moist.  Eyes:     Conjunctiva/sclera: Conjunctivae normal.     Pupils: Pupils are equal, round, and reactive to light.  Cardiovascular:     Rate and Rhythm: Normal rate and regular rhythm.     Pulses: Normal pulses.     Heart sounds: Normal heart sounds.  Pulmonary:     Effort: Pulmonary effort is normal.     Breath sounds: Normal breath sounds.  Abdominal:     General: Bowel sounds are normal.     Palpations: Abdomen is soft.  Musculoskeletal:     Cervical back: Normal range of motion. No tenderness.  Skin:    General: Skin is warm.     Findings: No bruising.  Neurological:     General: No focal deficit present.     Mental Status: She is alert and oriented to person, place, and time. Mental status is at baseline.  Psychiatric:        Mood and Affect: Mood normal.        Behavior: Behavior normal.        Thought Content: Thought content normal.        Judgment: Judgment normal.         Assessment & Plan:  Hyperlipidemia LDL goal <70 Assessment & Plan: On Lipitor with previously high triglycerides. Discussed dietary sources of triglycerides and potential need for fish oil capsules. - Order fasting lipid panel. - Consider fish oil capsules if triglycerides remain elevated.  Orders: -     Lipid panel  Essential hypertension Assessment & Plan: Blood  pressure well-controlled with propranolol. - Continue monitoring blood pressure at home once or twice a week. - Continue propranolol.  Orders: -     CBC with Differential/Platelet -     Comprehensive metabolic panel with GFR -     TSH -     Microalbumin / creatinine urine ratio  Post-menopausal -     VITAMIN D  25 Hydroxy (Vit-D Deficiency, Fractures)  Family history of breast cancer  Vitamin B12 deficiency -     Vitamin B12  Screening mammogram, encounter for -     3D Screening Mammogram, Left and Right; Future  Encounter for screening colonoscopy -     Ambulatory referral to Gastroenterology  Prediabetes Assessment & Plan: Prediabetes with previous A1c of 6.1%. Engages in regular exercise and a healthy diet. Discussed importance of regular meals and genetic factors. - Order fasting labs to check current A1c and lipid profile. - Encourage regular meals and continued exercise.  Orders: -     Hemoglobin A1c  History of CVA (cerebrovascular accident) Assessment & Plan: Stroke in 2021 with residual arm weakness and occasional hand numbness. On dual antiplatelet therapy with Plavix  and aspirin  to prevent further cerebrovascular events. - Continue Plavix  and aspirin  as prescribed. - Followed by neurologist at [facility] clinic.   Migraine with aura and without status migrainosus, not intractable Assessment & Plan: Migraines well-controlled with propranolol, which also aids in blood pressure management. - Continue propranolol for migraine prophylaxis.  Routine health maintenance Assessment & Plan: Due for preventive screenings and vaccinations. Discussed importance of screenings given family history of breast cancer and personal health history. - Order mammogram due to family history of breast cancer. - Order colonoscopy as part of routine screening. - Discuss Pap smear options and schedule as appropriate. - Recommend tetanus booster vaccination. - Discuss flu  vaccination.      Return in about 1 month (around 03/29/2024) for physical.   Ly Bacchi, NP

## 2024-02-29 ENCOUNTER — Ambulatory Visit: Payer: Self-pay | Admitting: Nurse Practitioner

## 2024-02-29 DIAGNOSIS — R748 Abnormal levels of other serum enzymes: Secondary | ICD-10-CM

## 2024-02-29 DIAGNOSIS — R809 Proteinuria, unspecified: Secondary | ICD-10-CM

## 2024-03-16 DIAGNOSIS — R7303 Prediabetes: Secondary | ICD-10-CM | POA: Insufficient documentation

## 2024-03-16 DIAGNOSIS — Z8673 Personal history of transient ischemic attack (TIA), and cerebral infarction without residual deficits: Secondary | ICD-10-CM | POA: Insufficient documentation

## 2024-03-16 DIAGNOSIS — Z0000 Encounter for general adult medical examination without abnormal findings: Secondary | ICD-10-CM | POA: Insufficient documentation

## 2024-03-16 DIAGNOSIS — G43109 Migraine with aura, not intractable, without status migrainosus: Secondary | ICD-10-CM | POA: Insufficient documentation

## 2024-03-16 NOTE — Assessment & Plan Note (Signed)
 Blood pressure well-controlled with propranolol. - Continue monitoring blood pressure at home once or twice a week. - Continue propranolol.

## 2024-03-16 NOTE — Assessment & Plan Note (Signed)
 Prediabetes with previous A1c of 6.1%. Engages in regular exercise and a healthy diet. Discussed importance of regular meals and genetic factors. - Order fasting labs to check current A1c and lipid profile. - Encourage regular meals and continued exercise.

## 2024-03-16 NOTE — Assessment & Plan Note (Signed)
 Due for preventive screenings and vaccinations. Discussed importance of screenings given family history of breast cancer and personal health history. - Order mammogram due to family history of breast cancer. - Order colonoscopy as part of routine screening. - Discuss Pap smear options and schedule as appropriate. - Recommend tetanus booster vaccination. - Discuss flu vaccination.

## 2024-03-16 NOTE — Assessment & Plan Note (Signed)
 On Lipitor with previously high triglycerides. Discussed dietary sources of triglycerides and potential need for fish oil capsules. - Order fasting lipid panel. - Consider fish oil capsules if triglycerides remain elevated.

## 2024-03-16 NOTE — Assessment & Plan Note (Signed)
 Migraines well-controlled with propranolol, which also aids in blood pressure management. - Continue propranolol for migraine prophylaxis.

## 2024-03-16 NOTE — Assessment & Plan Note (Signed)
 Stroke in 2021 with residual arm weakness and occasional hand numbness. On dual antiplatelet therapy with Plavix  and aspirin  to prevent further cerebrovascular events. - Continue Plavix  and aspirin  as prescribed. - Followed by neurologist at [facility] clinic.

## 2024-04-08 ENCOUNTER — Other Ambulatory Visit

## 2024-04-08 ENCOUNTER — Telehealth: Payer: Self-pay | Admitting: Nurse Practitioner

## 2024-04-08 NOTE — Telephone Encounter (Signed)
 Lm and sent MyChart message to reschedule 04/24/2024 appointment/provider out.

## 2024-04-10 ENCOUNTER — Encounter: Admitting: Nurse Practitioner

## 2024-04-18 NOTE — Telephone Encounter (Signed)
 Noted

## 2024-04-24 ENCOUNTER — Encounter: Admitting: Nurse Practitioner

## 2024-05-20 ENCOUNTER — Encounter: Admitting: Nurse Practitioner

## 2024-06-05 ENCOUNTER — Encounter: Admitting: Nurse Practitioner

## 2024-07-03 ENCOUNTER — Encounter: Admitting: Nurse Practitioner

## 2024-07-25 ENCOUNTER — Encounter: Admitting: Nurse Practitioner

## 2024-07-30 ENCOUNTER — Telehealth: Payer: Self-pay | Admitting: Nurse Practitioner

## 2024-07-30 NOTE — Telephone Encounter (Signed)
 Lm and sent MyChart message: Samantha Powers will be out of the office on December 16th.  Please call the office or reschedule your appointment through MyChart.   [id number] please reschedule appt

## 2024-08-19 ENCOUNTER — Encounter: Admitting: Nurse Practitioner

## 2024-09-30 ENCOUNTER — Encounter: Admitting: Nurse Practitioner

## 2024-10-28 ENCOUNTER — Encounter: Admitting: Nurse Practitioner
